# Patient Record
Sex: Female | Born: 1940
Health system: Southern US, Community
[De-identification: ages and names within clinical notes are randomized; demographics above are authoritative.]

## PROBLEM LIST (undated history)

## (undated) DIAGNOSIS — Z96659 Presence of unspecified artificial knee joint: Secondary | ICD-10-CM

## (undated) DIAGNOSIS — Z90710 Acquired absence of both cervix and uterus: Secondary | ICD-10-CM

## (undated) DIAGNOSIS — Z9889 Other specified postprocedural states: Secondary | ICD-10-CM

## (undated) HISTORY — PX: BREAST BIOPSY: SHX20

## (undated) HISTORY — DX: Acquired absence of both cervix and uterus: Z90.710

## (undated) HISTORY — PX: REPLACEMENT TOTAL KNEE: SUR1224

## (undated) HISTORY — DX: Presence of unspecified artificial knee joint: Z96.659

## (undated) HISTORY — DX: Other specified postprocedural states: Z98.890

## (undated) HISTORY — PX: ABDOMINAL HYSTERECTOMY: SHX81

## (undated) HISTORY — PX: THYROID SURGERY: SHX805

---

## 1999-06-22 ENCOUNTER — Other Ambulatory Visit: Admission: RE | Admit: 1999-06-22 | Discharge: 1999-06-22 | Payer: Self-pay | Admitting: *Deleted

## 1999-06-22 ENCOUNTER — Encounter (INDEPENDENT_AMBULATORY_CARE_PROVIDER_SITE_OTHER): Payer: Self-pay | Admitting: Specialist

## 1999-08-30 ENCOUNTER — Inpatient Hospital Stay (HOSPITAL_COMMUNITY): Admission: RE | Admit: 1999-08-30 | Discharge: 1999-09-01 | Payer: Self-pay | Admitting: *Deleted

## 1999-08-30 ENCOUNTER — Encounter (INDEPENDENT_AMBULATORY_CARE_PROVIDER_SITE_OTHER): Payer: Self-pay | Admitting: Specialist

## 2000-05-30 ENCOUNTER — Encounter: Payer: Self-pay | Admitting: Internal Medicine

## 2000-05-30 ENCOUNTER — Encounter: Admission: RE | Admit: 2000-05-30 | Discharge: 2000-05-30 | Payer: Self-pay | Admitting: Internal Medicine

## 2001-06-09 ENCOUNTER — Encounter: Admission: RE | Admit: 2001-06-09 | Discharge: 2001-06-09 | Payer: Self-pay | Admitting: Family Medicine

## 2001-06-09 ENCOUNTER — Encounter: Payer: Self-pay | Admitting: Family Medicine

## 2002-08-16 ENCOUNTER — Encounter: Admission: RE | Admit: 2002-08-16 | Discharge: 2002-08-16 | Payer: Self-pay | Admitting: Family Medicine

## 2002-08-16 ENCOUNTER — Encounter: Payer: Self-pay | Admitting: Family Medicine

## 2003-07-12 ENCOUNTER — Other Ambulatory Visit: Admission: RE | Admit: 2003-07-12 | Discharge: 2003-07-12 | Payer: Self-pay | Admitting: *Deleted

## 2003-11-16 ENCOUNTER — Encounter: Admission: RE | Admit: 2003-11-16 | Discharge: 2003-11-16 | Payer: Self-pay | Admitting: Family Medicine

## 2004-07-24 ENCOUNTER — Other Ambulatory Visit: Admission: RE | Admit: 2004-07-24 | Discharge: 2004-07-24 | Payer: Self-pay | Admitting: *Deleted

## 2004-12-19 ENCOUNTER — Ambulatory Visit (HOSPITAL_COMMUNITY): Admission: RE | Admit: 2004-12-19 | Discharge: 2004-12-20 | Payer: Self-pay | Admitting: Ophthalmology

## 2005-08-02 ENCOUNTER — Ambulatory Visit: Payer: Self-pay | Admitting: Family Medicine

## 2005-08-26 ENCOUNTER — Ambulatory Visit: Payer: Self-pay | Admitting: Family Medicine

## 2005-09-25 ENCOUNTER — Ambulatory Visit: Payer: Self-pay | Admitting: Family Medicine

## 2007-07-07 ENCOUNTER — Encounter (INDEPENDENT_AMBULATORY_CARE_PROVIDER_SITE_OTHER): Payer: Self-pay | Admitting: General Surgery

## 2007-07-07 ENCOUNTER — Ambulatory Visit (HOSPITAL_BASED_OUTPATIENT_CLINIC_OR_DEPARTMENT_OTHER): Admission: RE | Admit: 2007-07-07 | Discharge: 2007-07-07 | Payer: Self-pay | Admitting: General Surgery

## 2009-09-27 ENCOUNTER — Encounter: Admission: RE | Admit: 2009-09-27 | Discharge: 2009-09-27 | Payer: Self-pay | Admitting: Legal Medicine

## 2010-10-10 ENCOUNTER — Encounter: Admission: RE | Admit: 2010-10-10 | Discharge: 2010-10-10 | Payer: Self-pay | Admitting: Legal Medicine

## 2011-04-26 NOTE — Op Note (Signed)
Monica Marshall, Monica Marshall                ACCOUNT NO.:  [id number]   MEDICAL RECORD NO.:  [id number]          PATIENT TYPE:  OIB   LOCATION:  2899                         FACILITY:  [facility]   PHYSICIAN:  Lanna Poche, M.D. DATE OF BIRTH:  January 10, 1941   DATE OF PROCEDURE:  12/19/2004  DATE OF DISCHARGE:                                 OPERATIVE REPORT   PREOPERATIVE DIAGNOSIS:  Idiopathic macula hole, right eye.   POSTOPERATIVE DIAGNOSIS:  Idiopathic macula hole, right eye.   PROCEDURE:  Pars vitrectomy, membrane peeling and prophylactic laser  photocoagulation.  Gas fluid exchange.  Serum patch, 16% C3F8, right eye.   SURGEON:  Lanna Poche, M.D.   ASSISTANT:  Broadus John, LPN.   ANESTHESIA:  General endotracheal.   ESTIMATED BLOOD LOSS:  Less than 1 mL.   COMPLICATIONS:  None.   OPERATIVE NOTE:  The patient was taken to the operating room after the  induction of general anesthesia.  The right eye was prepped and draped in  the usual fashion.  The lid speculum was introduced.  The conjunctival  peritomy was developed temporally and superonasally with relaxing incisions  at 9 o'clock  and 1 o'clock  Hemostasis was obtained with eraser cautery.  Sclerotomies were fashioned 3.75 mm posterior to the limbus at 1:30 o'clock,  10:30 o'clock, and 7:30.  The superior sclerotomies were plugged, and a 4 mm  infusion cannula secured at the 7:30 sclerotomy with temporal suture of 7-0  Vicryl.  The tip was visually inspected and found to be in good position.  A  Landers ring was secured to the globe with 7-0 Vicryl sutures at 3 and 9.  The plugs were removed and a 30-degree presbyopic lens applied to the  surface of the eye.  Central core followed by a peripheral vitrectomy was  performed.  There was no posterior hyoid separation and the suction port of  vitrector was used to strip the posterior hyaloid off of the posterior  segment.  The peripheral vitrectomy was then completed.  A  magnified flat  lens was applied to the surface of the eye.  The absence of cortical  vitreous was confirmed with a silicone-tipped catheter, and then with the  MVR blade made two small incisions just into the surface of the retina  superotemporally and superonasally to the fovea.  The view was somewhat  impeded upon the cataract but the edges of the ILM were able to be lifted up  and peeled in two or three fragments with some difficulty.  I encountered an  adhesion of the ILM to the retina.  After the ILM had been removed, the  instrument was removed from the eye and the hole was plugged.  The lens and  lens ring were removed.  Inspection with indirect ophthalmoscope and scleral  pressure revealed there to be the suspicious area at 6 o'clock as well as  another area. These were surrounded with the indirect laser.  No clear full-  thickness retinal breaks were seen.  Using the indirect ophthalmoscope, a  gas fluid exchange was then performed.  The serum and the 16% C3F8 was drawn  up in the usual sterile fashion.  Attention was directed back to the eye  where the small amount of fluid settled posteriorly was removed, and the 2  drops of autologous serum placed over the macula.  The supranasal sclerotomy  was closed with 7-0 Vicryl.  Twenty mL of 16% C3F8 were infused through the  infusion line with egress through th supratemporal sclerotomy.  The  supratemporal sclerotomy was then closed.  The pressure was injected to 21  mmHg, and the infusion cannula removed.  The pressure was found to be  slightly elevated and was dropped down by letting a small amount of air  escape through the superior sclerotomy with the 0.12 forceps.  Pressure was  left at 21.  The conjunctiva was then drawn up and reapproximated with  interrupted running suture of 6-0 plain gut.  The subconjunctival space was  then injected with 10 mg of Decadron and 4 mg of ceftazidime as well as  irrigated with 0.75% Marcaine.   The lid speculum was then removed, and mixed  antibiotic ointment was applied to the surface of the eye.  A patch and  shield were then placed over the patient's right eye.  Upon waking from  anesthesia, the patient left the operating room in stable condition.      JTH/MEDQ  D:  12/19/2004  T:  12/19/2004  Job:  [id number]

## 2011-09-09 ENCOUNTER — Other Ambulatory Visit: Payer: Self-pay | Admitting: Legal Medicine

## 2011-09-16 ENCOUNTER — Other Ambulatory Visit: Payer: Self-pay | Admitting: Legal Medicine

## 2011-09-16 DIAGNOSIS — N6452 Nipple discharge: Secondary | ICD-10-CM

## 2011-09-18 ENCOUNTER — Ambulatory Visit
Admission: RE | Admit: 2011-09-18 | Discharge: 2011-09-18 | Disposition: A | Discharge status: Home / Self Care | Payer: Medicare HMO | Source: Ambulatory Visit | Attending: Legal Medicine | Admitting: Legal Medicine

## 2011-09-18 DIAGNOSIS — N6452 Nipple discharge: Secondary | ICD-10-CM

## 2011-12-24 DIAGNOSIS — N649 Disorder of breast, unspecified: Secondary | ICD-10-CM | POA: Diagnosis not present

## 2012-04-30 DIAGNOSIS — M25569 Pain in unspecified knee: Secondary | ICD-10-CM | POA: Diagnosis not present

## 2012-04-30 DIAGNOSIS — M1710 Unilateral primary osteoarthritis, unspecified knee: Secondary | ICD-10-CM | POA: Diagnosis not present

## 2012-05-07 DIAGNOSIS — H43819 Vitreous degeneration, unspecified eye: Secondary | ICD-10-CM | POA: Diagnosis not present

## 2012-05-07 DIAGNOSIS — H2510 Age-related nuclear cataract, unspecified eye: Secondary | ICD-10-CM | POA: Diagnosis not present

## 2012-05-07 DIAGNOSIS — M21619 Bunion of unspecified foot: Secondary | ICD-10-CM | POA: Diagnosis not present

## 2012-05-07 DIAGNOSIS — H35379 Puckering of macula, unspecified eye: Secondary | ICD-10-CM | POA: Diagnosis not present

## 2012-05-07 DIAGNOSIS — H31009 Unspecified chorioretinal scars, unspecified eye: Secondary | ICD-10-CM | POA: Diagnosis not present

## 2012-06-16 DIAGNOSIS — M2010 Hallux valgus (acquired), unspecified foot: Secondary | ICD-10-CM | POA: Diagnosis not present

## 2012-06-16 DIAGNOSIS — M21619 Bunion of unspecified foot: Secondary | ICD-10-CM | POA: Diagnosis not present

## 2012-08-17 DIAGNOSIS — R5381 Other malaise: Secondary | ICD-10-CM | POA: Diagnosis not present

## 2012-08-17 DIAGNOSIS — D649 Anemia, unspecified: Secondary | ICD-10-CM | POA: Diagnosis not present

## 2012-08-17 DIAGNOSIS — Z7901 Long term (current) use of anticoagulants: Secondary | ICD-10-CM | POA: Diagnosis not present

## 2012-08-17 DIAGNOSIS — R9431 Abnormal electrocardiogram [ECG] [EKG]: Secondary | ICD-10-CM | POA: Diagnosis not present

## 2012-08-17 DIAGNOSIS — Z01812 Encounter for preprocedural laboratory examination: Secondary | ICD-10-CM | POA: Diagnosis not present

## 2012-08-17 DIAGNOSIS — Z79899 Other long term (current) drug therapy: Secondary | ICD-10-CM | POA: Diagnosis not present

## 2012-08-17 DIAGNOSIS — R5383 Other fatigue: Secondary | ICD-10-CM | POA: Diagnosis not present

## 2012-08-18 DIAGNOSIS — H269 Unspecified cataract: Secondary | ICD-10-CM | POA: Diagnosis not present

## 2012-08-18 DIAGNOSIS — D649 Anemia, unspecified: Secondary | ICD-10-CM | POA: Diagnosis not present

## 2012-08-18 DIAGNOSIS — M1710 Unilateral primary osteoarthritis, unspecified knee: Secondary | ICD-10-CM | POA: Diagnosis not present

## 2012-08-18 DIAGNOSIS — E049 Nontoxic goiter, unspecified: Secondary | ICD-10-CM | POA: Diagnosis not present

## 2012-08-21 DIAGNOSIS — Z1231 Encounter for screening mammogram for malignant neoplasm of breast: Secondary | ICD-10-CM | POA: Diagnosis not present

## 2012-08-21 DIAGNOSIS — R079 Chest pain, unspecified: Secondary | ICD-10-CM | POA: Diagnosis not present

## 2012-08-21 DIAGNOSIS — E079 Disorder of thyroid, unspecified: Secondary | ICD-10-CM | POA: Diagnosis not present

## 2012-08-25 DIAGNOSIS — L819 Disorder of pigmentation, unspecified: Secondary | ICD-10-CM | POA: Diagnosis not present

## 2012-08-25 DIAGNOSIS — L708 Other acne: Secondary | ICD-10-CM | POA: Diagnosis not present

## 2012-09-01 DIAGNOSIS — R1310 Dysphagia, unspecified: Secondary | ICD-10-CM | POA: Diagnosis not present

## 2012-09-01 DIAGNOSIS — E042 Nontoxic multinodular goiter: Secondary | ICD-10-CM | POA: Diagnosis not present

## 2012-09-01 DIAGNOSIS — E041 Nontoxic single thyroid nodule: Secondary | ICD-10-CM | POA: Diagnosis not present

## 2012-09-01 DIAGNOSIS — L819 Disorder of pigmentation, unspecified: Secondary | ICD-10-CM | POA: Diagnosis not present

## 2012-09-17 DIAGNOSIS — M1710 Unilateral primary osteoarthritis, unspecified knee: Secondary | ICD-10-CM | POA: Diagnosis not present

## 2012-10-06 DIAGNOSIS — Z23 Encounter for immunization: Secondary | ICD-10-CM | POA: Diagnosis not present

## 2012-10-06 DIAGNOSIS — E876 Hypokalemia: Secondary | ICD-10-CM | POA: Diagnosis not present

## 2012-10-06 DIAGNOSIS — Z7982 Long term (current) use of aspirin: Secondary | ICD-10-CM | POA: Diagnosis not present

## 2012-10-06 DIAGNOSIS — D509 Iron deficiency anemia, unspecified: Secondary | ICD-10-CM | POA: Diagnosis not present

## 2012-10-06 DIAGNOSIS — Z853 Personal history of malignant neoplasm of breast: Secondary | ICD-10-CM | POA: Diagnosis not present

## 2012-10-06 DIAGNOSIS — IMO0002 Reserved for concepts with insufficient information to code with codable children: Secondary | ICD-10-CM | POA: Diagnosis not present

## 2012-10-06 DIAGNOSIS — M1710 Unilateral primary osteoarthritis, unspecified knee: Secondary | ICD-10-CM | POA: Diagnosis not present

## 2012-10-09 DIAGNOSIS — Z96659 Presence of unspecified artificial knee joint: Secondary | ICD-10-CM | POA: Diagnosis not present

## 2012-10-09 DIAGNOSIS — R269 Unspecified abnormalities of gait and mobility: Secondary | ICD-10-CM | POA: Diagnosis not present

## 2012-10-09 DIAGNOSIS — Z471 Aftercare following joint replacement surgery: Secondary | ICD-10-CM | POA: Diagnosis not present

## 2012-10-10 DIAGNOSIS — Z96659 Presence of unspecified artificial knee joint: Secondary | ICD-10-CM | POA: Diagnosis not present

## 2012-10-10 DIAGNOSIS — R269 Unspecified abnormalities of gait and mobility: Secondary | ICD-10-CM | POA: Diagnosis not present

## 2012-10-10 DIAGNOSIS — Z471 Aftercare following joint replacement surgery: Secondary | ICD-10-CM | POA: Diagnosis not present

## 2012-10-11 DIAGNOSIS — R269 Unspecified abnormalities of gait and mobility: Secondary | ICD-10-CM | POA: Diagnosis not present

## 2012-10-11 DIAGNOSIS — Z471 Aftercare following joint replacement surgery: Secondary | ICD-10-CM | POA: Diagnosis not present

## 2012-10-11 DIAGNOSIS — Z96659 Presence of unspecified artificial knee joint: Secondary | ICD-10-CM | POA: Diagnosis not present

## 2012-10-12 DIAGNOSIS — R269 Unspecified abnormalities of gait and mobility: Secondary | ICD-10-CM | POA: Diagnosis not present

## 2012-10-12 DIAGNOSIS — Z471 Aftercare following joint replacement surgery: Secondary | ICD-10-CM | POA: Diagnosis not present

## 2012-10-12 DIAGNOSIS — Z96659 Presence of unspecified artificial knee joint: Secondary | ICD-10-CM | POA: Diagnosis not present

## 2012-10-13 DIAGNOSIS — R269 Unspecified abnormalities of gait and mobility: Secondary | ICD-10-CM | POA: Diagnosis not present

## 2012-10-13 DIAGNOSIS — Z471 Aftercare following joint replacement surgery: Secondary | ICD-10-CM | POA: Diagnosis not present

## 2012-10-13 DIAGNOSIS — Z96659 Presence of unspecified artificial knee joint: Secondary | ICD-10-CM | POA: Diagnosis not present

## 2012-10-14 DIAGNOSIS — R269 Unspecified abnormalities of gait and mobility: Secondary | ICD-10-CM | POA: Diagnosis not present

## 2012-10-14 DIAGNOSIS — Z96659 Presence of unspecified artificial knee joint: Secondary | ICD-10-CM | POA: Diagnosis not present

## 2012-10-14 DIAGNOSIS — Z471 Aftercare following joint replacement surgery: Secondary | ICD-10-CM | POA: Diagnosis not present

## 2012-10-15 DIAGNOSIS — Z471 Aftercare following joint replacement surgery: Secondary | ICD-10-CM | POA: Diagnosis not present

## 2012-10-15 DIAGNOSIS — Z96659 Presence of unspecified artificial knee joint: Secondary | ICD-10-CM | POA: Diagnosis not present

## 2012-10-15 DIAGNOSIS — R269 Unspecified abnormalities of gait and mobility: Secondary | ICD-10-CM | POA: Diagnosis not present

## 2012-10-16 DIAGNOSIS — Z471 Aftercare following joint replacement surgery: Secondary | ICD-10-CM | POA: Diagnosis not present

## 2012-10-16 DIAGNOSIS — Z96659 Presence of unspecified artificial knee joint: Secondary | ICD-10-CM | POA: Diagnosis not present

## 2012-10-16 DIAGNOSIS — R269 Unspecified abnormalities of gait and mobility: Secondary | ICD-10-CM | POA: Diagnosis not present

## 2012-10-20 DIAGNOSIS — M25569 Pain in unspecified knee: Secondary | ICD-10-CM | POA: Diagnosis not present

## 2012-10-22 DIAGNOSIS — M25569 Pain in unspecified knee: Secondary | ICD-10-CM | POA: Diagnosis not present

## 2012-10-23 DIAGNOSIS — Z96659 Presence of unspecified artificial knee joint: Secondary | ICD-10-CM | POA: Diagnosis not present

## 2012-10-27 DIAGNOSIS — M25569 Pain in unspecified knee: Secondary | ICD-10-CM | POA: Diagnosis not present

## 2012-10-29 DIAGNOSIS — M25569 Pain in unspecified knee: Secondary | ICD-10-CM | POA: Diagnosis not present

## 2012-11-02 DIAGNOSIS — Z79899 Other long term (current) drug therapy: Secondary | ICD-10-CM | POA: Diagnosis not present

## 2012-11-02 DIAGNOSIS — R5381 Other malaise: Secondary | ICD-10-CM | POA: Diagnosis not present

## 2012-11-02 DIAGNOSIS — R5383 Other fatigue: Secondary | ICD-10-CM | POA: Diagnosis not present

## 2012-11-02 DIAGNOSIS — Z01812 Encounter for preprocedural laboratory examination: Secondary | ICD-10-CM | POA: Diagnosis not present

## 2012-11-02 DIAGNOSIS — Z01818 Encounter for other preprocedural examination: Secondary | ICD-10-CM | POA: Diagnosis not present

## 2012-11-03 DIAGNOSIS — D649 Anemia, unspecified: Secondary | ICD-10-CM | POA: Diagnosis not present

## 2012-11-03 DIAGNOSIS — E049 Nontoxic goiter, unspecified: Secondary | ICD-10-CM | POA: Diagnosis not present

## 2012-11-03 DIAGNOSIS — M1710 Unilateral primary osteoarthritis, unspecified knee: Secondary | ICD-10-CM | POA: Diagnosis not present

## 2012-11-03 DIAGNOSIS — M25569 Pain in unspecified knee: Secondary | ICD-10-CM | POA: Diagnosis not present

## 2012-11-03 DIAGNOSIS — H269 Unspecified cataract: Secondary | ICD-10-CM | POA: Diagnosis not present

## 2012-11-04 DIAGNOSIS — Z96659 Presence of unspecified artificial knee joint: Secondary | ICD-10-CM | POA: Diagnosis not present

## 2012-11-06 DIAGNOSIS — Z79899 Other long term (current) drug therapy: Secondary | ICD-10-CM | POA: Diagnosis not present

## 2012-11-06 DIAGNOSIS — Z01818 Encounter for other preprocedural examination: Secondary | ICD-10-CM | POA: Diagnosis not present

## 2012-11-06 DIAGNOSIS — R5381 Other malaise: Secondary | ICD-10-CM | POA: Diagnosis not present

## 2012-11-09 DIAGNOSIS — IMO0002 Reserved for concepts with insufficient information to code with codable children: Secondary | ICD-10-CM | POA: Diagnosis not present

## 2012-11-09 DIAGNOSIS — M25569 Pain in unspecified knee: Secondary | ICD-10-CM | POA: Diagnosis not present

## 2012-11-09 DIAGNOSIS — M1710 Unilateral primary osteoarthritis, unspecified knee: Secondary | ICD-10-CM | POA: Diagnosis not present

## 2012-11-09 DIAGNOSIS — Z01818 Encounter for other preprocedural examination: Secondary | ICD-10-CM | POA: Diagnosis not present

## 2012-11-10 DIAGNOSIS — M25569 Pain in unspecified knee: Secondary | ICD-10-CM | POA: Diagnosis not present

## 2012-11-12 DIAGNOSIS — M25569 Pain in unspecified knee: Secondary | ICD-10-CM | POA: Diagnosis not present

## 2012-11-12 DIAGNOSIS — Z96659 Presence of unspecified artificial knee joint: Secondary | ICD-10-CM | POA: Diagnosis not present

## 2012-11-16 DIAGNOSIS — M25569 Pain in unspecified knee: Secondary | ICD-10-CM | POA: Diagnosis not present

## 2012-11-18 DIAGNOSIS — M25569 Pain in unspecified knee: Secondary | ICD-10-CM | POA: Diagnosis not present

## 2012-11-20 DIAGNOSIS — M1710 Unilateral primary osteoarthritis, unspecified knee: Secondary | ICD-10-CM | POA: Diagnosis not present

## 2012-11-25 DIAGNOSIS — IMO0002 Reserved for concepts with insufficient information to code with codable children: Secondary | ICD-10-CM | POA: Diagnosis not present

## 2012-11-25 DIAGNOSIS — Z471 Aftercare following joint replacement surgery: Secondary | ICD-10-CM | POA: Diagnosis not present

## 2012-11-25 DIAGNOSIS — M1710 Unilateral primary osteoarthritis, unspecified knee: Secondary | ICD-10-CM | POA: Diagnosis not present

## 2012-11-25 DIAGNOSIS — M25569 Pain in unspecified knee: Secondary | ICD-10-CM | POA: Diagnosis not present

## 2012-11-25 DIAGNOSIS — D638 Anemia in other chronic diseases classified elsewhere: Secondary | ICD-10-CM | POA: Diagnosis not present

## 2012-11-25 DIAGNOSIS — D649 Anemia, unspecified: Secondary | ICD-10-CM | POA: Diagnosis not present

## 2012-11-25 DIAGNOSIS — E049 Nontoxic goiter, unspecified: Secondary | ICD-10-CM | POA: Diagnosis not present

## 2012-11-25 DIAGNOSIS — E559 Vitamin D deficiency, unspecified: Secondary | ICD-10-CM | POA: Diagnosis not present

## 2012-11-25 DIAGNOSIS — H3320 Serous retinal detachment, unspecified eye: Secondary | ICD-10-CM | POA: Diagnosis not present

## 2012-11-25 DIAGNOSIS — R269 Unspecified abnormalities of gait and mobility: Secondary | ICD-10-CM | POA: Diagnosis not present

## 2012-11-25 DIAGNOSIS — Z96659 Presence of unspecified artificial knee joint: Secondary | ICD-10-CM | POA: Diagnosis not present

## 2012-11-28 DIAGNOSIS — H3320 Serous retinal detachment, unspecified eye: Secondary | ICD-10-CM | POA: Diagnosis not present

## 2012-11-28 DIAGNOSIS — Z471 Aftercare following joint replacement surgery: Secondary | ICD-10-CM | POA: Diagnosis not present

## 2012-11-28 DIAGNOSIS — IMO0002 Reserved for concepts with insufficient information to code with codable children: Secondary | ICD-10-CM | POA: Diagnosis not present

## 2012-11-28 DIAGNOSIS — M1710 Unilateral primary osteoarthritis, unspecified knee: Secondary | ICD-10-CM | POA: Diagnosis not present

## 2012-11-28 DIAGNOSIS — E559 Vitamin D deficiency, unspecified: Secondary | ICD-10-CM | POA: Diagnosis not present

## 2012-11-28 DIAGNOSIS — Z96659 Presence of unspecified artificial knee joint: Secondary | ICD-10-CM | POA: Diagnosis not present

## 2012-11-28 DIAGNOSIS — M8448XA Pathological fracture, other site, initial encounter for fracture: Secondary | ICD-10-CM | POA: Diagnosis not present

## 2012-11-28 DIAGNOSIS — R269 Unspecified abnormalities of gait and mobility: Secondary | ICD-10-CM | POA: Diagnosis not present

## 2012-11-28 DIAGNOSIS — G8918 Other acute postprocedural pain: Secondary | ICD-10-CM | POA: Diagnosis not present

## 2012-11-28 DIAGNOSIS — D638 Anemia in other chronic diseases classified elsewhere: Secondary | ICD-10-CM | POA: Diagnosis not present

## 2012-11-28 DIAGNOSIS — M25569 Pain in unspecified knee: Secondary | ICD-10-CM | POA: Diagnosis not present

## 2012-11-28 DIAGNOSIS — E049 Nontoxic goiter, unspecified: Secondary | ICD-10-CM | POA: Diagnosis not present

## 2012-12-04 DIAGNOSIS — G8918 Other acute postprocedural pain: Secondary | ICD-10-CM | POA: Diagnosis not present

## 2012-12-04 DIAGNOSIS — Z96659 Presence of unspecified artificial knee joint: Secondary | ICD-10-CM | POA: Diagnosis not present

## 2012-12-04 DIAGNOSIS — M8448XA Pathological fracture, other site, initial encounter for fracture: Secondary | ICD-10-CM | POA: Diagnosis not present

## 2012-12-04 DIAGNOSIS — D638 Anemia in other chronic diseases classified elsewhere: Secondary | ICD-10-CM | POA: Diagnosis not present

## 2012-12-14 DIAGNOSIS — M25569 Pain in unspecified knee: Secondary | ICD-10-CM | POA: Diagnosis not present

## 2012-12-21 DIAGNOSIS — M25569 Pain in unspecified knee: Secondary | ICD-10-CM | POA: Diagnosis not present

## 2012-12-21 DIAGNOSIS — E049 Nontoxic goiter, unspecified: Secondary | ICD-10-CM | POA: Diagnosis not present

## 2012-12-21 DIAGNOSIS — H3320 Serous retinal detachment, unspecified eye: Secondary | ICD-10-CM | POA: Diagnosis not present

## 2012-12-23 DIAGNOSIS — M25569 Pain in unspecified knee: Secondary | ICD-10-CM | POA: Diagnosis not present

## 2012-12-25 DIAGNOSIS — M25569 Pain in unspecified knee: Secondary | ICD-10-CM | POA: Diagnosis not present

## 2012-12-29 DIAGNOSIS — M25569 Pain in unspecified knee: Secondary | ICD-10-CM | POA: Diagnosis not present

## 2012-12-30 DIAGNOSIS — M25569 Pain in unspecified knee: Secondary | ICD-10-CM | POA: Diagnosis not present

## 2012-12-31 DIAGNOSIS — Z96659 Presence of unspecified artificial knee joint: Secondary | ICD-10-CM | POA: Diagnosis not present

## 2013-01-01 DIAGNOSIS — M25569 Pain in unspecified knee: Secondary | ICD-10-CM | POA: Diagnosis not present

## 2013-01-05 DIAGNOSIS — M25569 Pain in unspecified knee: Secondary | ICD-10-CM | POA: Diagnosis not present

## 2013-01-07 DIAGNOSIS — M25569 Pain in unspecified knee: Secondary | ICD-10-CM | POA: Diagnosis not present

## 2013-01-12 DIAGNOSIS — M25569 Pain in unspecified knee: Secondary | ICD-10-CM | POA: Diagnosis not present

## 2013-01-26 DIAGNOSIS — H26499 Other secondary cataract, unspecified eye: Secondary | ICD-10-CM | POA: Diagnosis not present

## 2013-01-26 DIAGNOSIS — M25569 Pain in unspecified knee: Secondary | ICD-10-CM | POA: Diagnosis not present

## 2013-01-26 DIAGNOSIS — H2589 Other age-related cataract: Secondary | ICD-10-CM | POA: Diagnosis not present

## 2013-01-26 DIAGNOSIS — H5210 Myopia, unspecified eye: Secondary | ICD-10-CM | POA: Diagnosis not present

## 2013-01-28 DIAGNOSIS — M25569 Pain in unspecified knee: Secondary | ICD-10-CM | POA: Diagnosis not present

## 2013-02-02 DIAGNOSIS — M25569 Pain in unspecified knee: Secondary | ICD-10-CM | POA: Diagnosis not present

## 2013-02-03 DIAGNOSIS — H2510 Age-related nuclear cataract, unspecified eye: Secondary | ICD-10-CM | POA: Diagnosis not present

## 2013-02-03 DIAGNOSIS — H18609 Keratoconus, unspecified, unspecified eye: Secondary | ICD-10-CM | POA: Diagnosis not present

## 2013-02-03 DIAGNOSIS — H52209 Unspecified astigmatism, unspecified eye: Secondary | ICD-10-CM | POA: Diagnosis not present

## 2013-02-03 DIAGNOSIS — H35349 Macular cyst, hole, or pseudohole, unspecified eye: Secondary | ICD-10-CM | POA: Diagnosis not present

## 2013-02-03 DIAGNOSIS — H2589 Other age-related cataract: Secondary | ICD-10-CM | POA: Diagnosis not present

## 2013-02-09 DIAGNOSIS — M25569 Pain in unspecified knee: Secondary | ICD-10-CM | POA: Diagnosis not present

## 2013-02-11 DIAGNOSIS — H52209 Unspecified astigmatism, unspecified eye: Secondary | ICD-10-CM | POA: Diagnosis not present

## 2013-02-16 DIAGNOSIS — M25569 Pain in unspecified knee: Secondary | ICD-10-CM | POA: Diagnosis not present

## 2013-02-18 DIAGNOSIS — M25569 Pain in unspecified knee: Secondary | ICD-10-CM | POA: Diagnosis not present

## 2013-02-24 DIAGNOSIS — M25569 Pain in unspecified knee: Secondary | ICD-10-CM | POA: Diagnosis not present

## 2013-02-26 DIAGNOSIS — M25569 Pain in unspecified knee: Secondary | ICD-10-CM | POA: Diagnosis not present

## 2013-03-03 DIAGNOSIS — M25569 Pain in unspecified knee: Secondary | ICD-10-CM | POA: Diagnosis not present

## 2013-10-13 DIAGNOSIS — Z1231 Encounter for screening mammogram for malignant neoplasm of breast: Secondary | ICD-10-CM | POA: Diagnosis not present

## 2013-12-15 DIAGNOSIS — Z23 Encounter for immunization: Secondary | ICD-10-CM | POA: Diagnosis not present

## 2013-12-15 DIAGNOSIS — E049 Nontoxic goiter, unspecified: Secondary | ICD-10-CM | POA: Diagnosis not present

## 2013-12-15 DIAGNOSIS — E785 Hyperlipidemia, unspecified: Secondary | ICD-10-CM | POA: Diagnosis not present

## 2013-12-15 DIAGNOSIS — D649 Anemia, unspecified: Secondary | ICD-10-CM | POA: Diagnosis not present

## 2013-12-15 DIAGNOSIS — Z0000 Encounter for general adult medical examination without abnormal findings: Secondary | ICD-10-CM | POA: Diagnosis not present

## 2014-03-22 DIAGNOSIS — L259 Unspecified contact dermatitis, unspecified cause: Secondary | ICD-10-CM | POA: Diagnosis not present

## 2014-05-18 DIAGNOSIS — E785 Hyperlipidemia, unspecified: Secondary | ICD-10-CM | POA: Diagnosis not present

## 2014-05-18 DIAGNOSIS — L259 Unspecified contact dermatitis, unspecified cause: Secondary | ICD-10-CM | POA: Diagnosis not present

## 2014-08-03 DIAGNOSIS — E049 Nontoxic goiter, unspecified: Secondary | ICD-10-CM | POA: Diagnosis not present

## 2014-08-03 DIAGNOSIS — E785 Hyperlipidemia, unspecified: Secondary | ICD-10-CM | POA: Diagnosis not present

## 2014-08-03 DIAGNOSIS — H3320 Serous retinal detachment, unspecified eye: Secondary | ICD-10-CM | POA: Diagnosis not present

## 2014-08-03 DIAGNOSIS — Z6833 Body mass index (BMI) 33.0-33.9, adult: Secondary | ICD-10-CM | POA: Diagnosis not present

## 2014-08-08 DIAGNOSIS — E049 Nontoxic goiter, unspecified: Secondary | ICD-10-CM | POA: Diagnosis not present

## 2015-01-31 DIAGNOSIS — E785 Hyperlipidemia, unspecified: Secondary | ICD-10-CM | POA: Diagnosis not present

## 2015-01-31 DIAGNOSIS — E049 Nontoxic goiter, unspecified: Secondary | ICD-10-CM | POA: Diagnosis not present

## 2015-01-31 DIAGNOSIS — H3570 Unspecified separation of retinal layers: Secondary | ICD-10-CM | POA: Diagnosis not present

## 2015-01-31 DIAGNOSIS — E739 Lactose intolerance, unspecified: Secondary | ICD-10-CM | POA: Diagnosis not present

## 2015-03-14 DIAGNOSIS — L819 Disorder of pigmentation, unspecified: Secondary | ICD-10-CM | POA: Diagnosis not present

## 2015-03-29 DIAGNOSIS — R5383 Other fatigue: Secondary | ICD-10-CM | POA: Diagnosis not present

## 2015-03-29 DIAGNOSIS — Z6834 Body mass index (BMI) 34.0-34.9, adult: Secondary | ICD-10-CM | POA: Diagnosis not present

## 2015-03-29 DIAGNOSIS — Z8349 Family history of other endocrine, nutritional and metabolic diseases: Secondary | ICD-10-CM | POA: Diagnosis not present

## 2015-03-30 DIAGNOSIS — E785 Hyperlipidemia, unspecified: Secondary | ICD-10-CM | POA: Diagnosis not present

## 2015-03-30 DIAGNOSIS — E739 Lactose intolerance, unspecified: Secondary | ICD-10-CM | POA: Diagnosis not present

## 2015-03-30 DIAGNOSIS — E049 Nontoxic goiter, unspecified: Secondary | ICD-10-CM | POA: Diagnosis not present

## 2015-04-20 DIAGNOSIS — H26492 Other secondary cataract, left eye: Secondary | ICD-10-CM | POA: Diagnosis not present

## 2015-04-20 DIAGNOSIS — H338 Other retinal detachments: Secondary | ICD-10-CM | POA: Diagnosis not present

## 2015-05-19 DIAGNOSIS — L239 Allergic contact dermatitis, unspecified cause: Secondary | ICD-10-CM | POA: Diagnosis not present

## 2015-05-19 DIAGNOSIS — Z6835 Body mass index (BMI) 35.0-35.9, adult: Secondary | ICD-10-CM | POA: Diagnosis not present

## 2015-06-20 DIAGNOSIS — R1310 Dysphagia, unspecified: Secondary | ICD-10-CM | POA: Diagnosis not present

## 2015-06-20 DIAGNOSIS — E041 Nontoxic single thyroid nodule: Secondary | ICD-10-CM | POA: Diagnosis not present

## 2015-07-03 ENCOUNTER — Other Ambulatory Visit: Payer: Self-pay

## 2015-07-03 DIAGNOSIS — C73 Malignant neoplasm of thyroid gland: Secondary | ICD-10-CM | POA: Diagnosis not present

## 2015-07-03 DIAGNOSIS — E041 Nontoxic single thyroid nodule: Secondary | ICD-10-CM | POA: Diagnosis not present

## 2015-07-03 DIAGNOSIS — Z7982 Long term (current) use of aspirin: Secondary | ICD-10-CM | POA: Diagnosis not present

## 2015-07-03 DIAGNOSIS — R1310 Dysphagia, unspecified: Secondary | ICD-10-CM | POA: Diagnosis not present

## 2015-08-01 DIAGNOSIS — E785 Hyperlipidemia, unspecified: Secondary | ICD-10-CM | POA: Diagnosis not present

## 2015-08-01 DIAGNOSIS — R490 Dysphonia: Secondary | ICD-10-CM | POA: Diagnosis not present

## 2015-08-01 DIAGNOSIS — Z6833 Body mass index (BMI) 33.0-33.9, adult: Secondary | ICD-10-CM | POA: Diagnosis not present

## 2015-08-24 DIAGNOSIS — R490 Dysphonia: Secondary | ICD-10-CM | POA: Diagnosis not present

## 2015-08-24 DIAGNOSIS — E890 Postprocedural hypothyroidism: Secondary | ICD-10-CM | POA: Diagnosis not present

## 2015-09-04 DIAGNOSIS — R490 Dysphonia: Secondary | ICD-10-CM | POA: Diagnosis not present

## 2015-09-12 DIAGNOSIS — R490 Dysphonia: Secondary | ICD-10-CM | POA: Diagnosis not present

## 2015-09-13 DIAGNOSIS — R490 Dysphonia: Secondary | ICD-10-CM | POA: Diagnosis not present

## 2015-09-19 DIAGNOSIS — R490 Dysphonia: Secondary | ICD-10-CM | POA: Diagnosis not present

## 2015-09-21 DIAGNOSIS — R490 Dysphonia: Secondary | ICD-10-CM | POA: Diagnosis not present

## 2015-09-26 DIAGNOSIS — R490 Dysphonia: Secondary | ICD-10-CM | POA: Diagnosis not present

## 2015-09-28 DIAGNOSIS — R490 Dysphonia: Secondary | ICD-10-CM | POA: Diagnosis not present

## 2015-10-05 DIAGNOSIS — Z8709 Personal history of other diseases of the respiratory system: Secondary | ICD-10-CM | POA: Diagnosis not present

## 2015-10-30 DIAGNOSIS — Z23 Encounter for immunization: Secondary | ICD-10-CM | POA: Diagnosis not present

## 2015-11-09 DIAGNOSIS — Z6834 Body mass index (BMI) 34.0-34.9, adult: Secondary | ICD-10-CM | POA: Diagnosis not present

## 2015-11-09 DIAGNOSIS — L239 Allergic contact dermatitis, unspecified cause: Secondary | ICD-10-CM | POA: Diagnosis not present

## 2015-11-13 DIAGNOSIS — Z85850 Personal history of malignant neoplasm of thyroid: Secondary | ICD-10-CM | POA: Diagnosis not present

## 2015-11-23 DIAGNOSIS — Z85850 Personal history of malignant neoplasm of thyroid: Secondary | ICD-10-CM | POA: Diagnosis not present

## 2015-11-23 DIAGNOSIS — J343 Hypertrophy of nasal turbinates: Secondary | ICD-10-CM | POA: Diagnosis not present

## 2015-11-23 DIAGNOSIS — J342 Deviated nasal septum: Secondary | ICD-10-CM | POA: Diagnosis not present

## 2015-11-23 DIAGNOSIS — R490 Dysphonia: Secondary | ICD-10-CM | POA: Diagnosis not present

## 2015-11-23 DIAGNOSIS — E890 Postprocedural hypothyroidism: Secondary | ICD-10-CM | POA: Diagnosis not present

## 2015-12-21 DIAGNOSIS — Z6833 Body mass index (BMI) 33.0-33.9, adult: Secondary | ICD-10-CM | POA: Diagnosis not present

## 2015-12-21 DIAGNOSIS — L209 Atopic dermatitis, unspecified: Secondary | ICD-10-CM | POA: Diagnosis not present

## 2016-01-29 DIAGNOSIS — Z6833 Body mass index (BMI) 33.0-33.9, adult: Secondary | ICD-10-CM | POA: Diagnosis not present

## 2016-01-29 DIAGNOSIS — C73 Malignant neoplasm of thyroid gland: Secondary | ICD-10-CM | POA: Diagnosis not present

## 2016-01-29 DIAGNOSIS — E785 Hyperlipidemia, unspecified: Secondary | ICD-10-CM | POA: Diagnosis not present

## 2016-04-08 DIAGNOSIS — L209 Atopic dermatitis, unspecified: Secondary | ICD-10-CM | POA: Diagnosis not present

## 2016-04-08 DIAGNOSIS — Z6833 Body mass index (BMI) 33.0-33.9, adult: Secondary | ICD-10-CM | POA: Diagnosis not present

## 2016-05-23 DIAGNOSIS — J342 Deviated nasal septum: Secondary | ICD-10-CM | POA: Diagnosis not present

## 2016-05-23 DIAGNOSIS — R490 Dysphonia: Secondary | ICD-10-CM | POA: Diagnosis not present

## 2016-05-23 DIAGNOSIS — J3801 Paralysis of vocal cords and larynx, unilateral: Secondary | ICD-10-CM | POA: Diagnosis not present

## 2016-05-23 DIAGNOSIS — H9319 Tinnitus, unspecified ear: Secondary | ICD-10-CM | POA: Diagnosis not present

## 2016-05-23 DIAGNOSIS — Z85850 Personal history of malignant neoplasm of thyroid: Secondary | ICD-10-CM | POA: Diagnosis not present

## 2016-06-24 DIAGNOSIS — J3800 Paralysis of vocal cords and larynx, unspecified: Secondary | ICD-10-CM | POA: Diagnosis not present

## 2016-06-24 DIAGNOSIS — E785 Hyperlipidemia, unspecified: Secondary | ICD-10-CM | POA: Diagnosis not present

## 2016-06-24 DIAGNOSIS — E739 Lactose intolerance, unspecified: Secondary | ICD-10-CM | POA: Diagnosis not present

## 2016-06-24 DIAGNOSIS — Z6827 Body mass index (BMI) 27.0-27.9, adult: Secondary | ICD-10-CM | POA: Diagnosis not present

## 2016-06-24 DIAGNOSIS — C73 Malignant neoplasm of thyroid gland: Secondary | ICD-10-CM | POA: Diagnosis not present

## 2016-07-30 DIAGNOSIS — C73 Malignant neoplasm of thyroid gland: Secondary | ICD-10-CM | POA: Diagnosis not present

## 2016-07-30 DIAGNOSIS — J3800 Paralysis of vocal cords and larynx, unspecified: Secondary | ICD-10-CM | POA: Diagnosis not present

## 2016-07-30 DIAGNOSIS — E785 Hyperlipidemia, unspecified: Secondary | ICD-10-CM | POA: Diagnosis not present

## 2016-07-30 DIAGNOSIS — E739 Lactose intolerance, unspecified: Secondary | ICD-10-CM | POA: Diagnosis not present

## 2016-08-08 ENCOUNTER — Other Ambulatory Visit: Payer: Self-pay

## 2016-12-06 DIAGNOSIS — H35373 Puckering of macula, bilateral: Secondary | ICD-10-CM | POA: Diagnosis not present

## 2016-12-06 DIAGNOSIS — H26492 Other secondary cataract, left eye: Secondary | ICD-10-CM | POA: Diagnosis not present

## 2017-01-14 DIAGNOSIS — R21 Rash and other nonspecific skin eruption: Secondary | ICD-10-CM | POA: Diagnosis not present

## 2017-01-27 DIAGNOSIS — C73 Malignant neoplasm of thyroid gland: Secondary | ICD-10-CM | POA: Diagnosis not present

## 2017-01-27 DIAGNOSIS — H33001 Unspecified retinal detachment with retinal break, right eye: Secondary | ICD-10-CM | POA: Diagnosis not present

## 2017-01-27 DIAGNOSIS — E872 Acidosis: Secondary | ICD-10-CM | POA: Diagnosis not present

## 2017-01-27 DIAGNOSIS — Z23 Encounter for immunization: Secondary | ICD-10-CM | POA: Diagnosis not present

## 2017-01-27 DIAGNOSIS — E782 Mixed hyperlipidemia: Secondary | ICD-10-CM | POA: Diagnosis not present

## 2017-01-27 DIAGNOSIS — E039 Hypothyroidism, unspecified: Secondary | ICD-10-CM | POA: Diagnosis not present

## 2017-04-02 ENCOUNTER — Encounter: Payer: Self-pay | Admitting: Family Medicine

## 2017-04-02 ENCOUNTER — Ambulatory Visit (INDEPENDENT_AMBULATORY_CARE_PROVIDER_SITE_OTHER): Payer: Medicare Other | Admitting: Family Medicine

## 2017-04-02 VITALS — BP 112/55 | HR 62 | Ht 60.0 in | Wt 169.6 lb

## 2017-04-02 DIAGNOSIS — R413 Other amnesia: Secondary | ICD-10-CM | POA: Insufficient documentation

## 2017-04-02 DIAGNOSIS — F321 Major depressive disorder, single episode, moderate: Secondary | ICD-10-CM

## 2017-04-02 NOTE — Assessment & Plan Note (Signed)
MMSE score of 24/30. Passing is 28/30 ( she did some college).

## 2017-04-02 NOTE — Progress Notes (Signed)
Subjective:    Patient ID: Monica Marshall, female    DOB: 1941-11-12, 76 y.o.   MRN: [id number]  HPI 76 year old female comes in today to establish care. Her biggest concern is forgetfulness. She was hospitalized about 3 or 4 years ago and felt she was overmedicated at that time and ever since then feels like she's had difficulty with her memory. She is mostly it family members telling her that she is repeating herself. She personally doesn't feel like there is a big problem. She occasionally will forget peoples last names but usually not the first names. She denies losing things. She is able to take care of her own bills and management denture rental properties. She does her own shopping and lives alone. She feels like she sleeps well. She has no prior history of heart disease, stroke or peripheral vascular disease. She is not currently taking any prescription medications that would be causing any issues with her memory.He also not long ago lost her mother and her husband and has had to take over helping to manage rental properties etc. She does complain of feeling down several days of the week.  Dictation #1 [id number]  [id number]  Review of Systems  BP (!) 112/55   Pulse 62   Ht 5' (1.524 m)   Wt 169 lb 9.6 oz (76.9 kg)   BMI 33.12 kg/m     No Known Allergies  Past Medical History:  Diagnosis Date  . History of hysterectomy   . History of thyroid surgery   . History of total knee replacement     Past Surgical History:  Procedure Laterality Date  . ABDOMINAL HYSTERECTOMY    . REPLACEMENT TOTAL KNEE    . THYROID SURGERY      Social History   Social History  . Marital status: Married    Spouse name: N/A  . Number of children: N/A  . Years of education: N/A   Occupational History  . Not on file.   Social History Main Topics  . Smoking status: Never Smoker  . Smokeless tobacco: Never Used  . Alcohol use No  . Drug use: No  . Sexual activity: No   Other Topics  Concern  . Not on file   Social History Narrative  . No narrative on file    Family History  Problem Relation Age of Onset  . Cancer Father   . Alcohol abuse Paternal Uncle     No outpatient encounter prescriptions on file as of 04/02/2017.   No facility-administered encounter medications on file as of 04/02/2017.            Objective:   Physical Exam  Constitutional: She is oriented to person, place, and time. She appears well-developed and well-nourished.  HENT:  Head: Normocephalic and atraumatic.  Cardiovascular: Normal rate, regular rhythm and normal heart sounds.   No carotid bruits  Pulmonary/Chest: Effort normal and breath sounds normal.  Neurological: She is alert and oriented to person, place, and time.  Skin: Skin is warm and dry.  Psychiatric: She has a normal mood and affect. Her behavior is normal.       Assessment & Plan:  Memory impairment- Sounds like her family is more concerned that she really is noticing any problems. But at this point I think it is worth working up in evaluating further. Will perform any mental status exam today. I think this is also helpful in establishing a baseline. We'll get some additional lab work today just  to rule out thyroid disorder, looked like disturbance, anemia etc. We'll also check an RPR. Consider head CT for further evaluation to see if she might have some microvascular changes or eye or sign of stroke that may have caused some memory issues.  Many mental status exam score was 24 out of 30. Passing score based on the fact that she did complete some technical college is 28 out of 30. If we just count completion of high school the passing score would be 27 out of 30. She did miss a point for the day of the week as well as name the location of our building that she is new here today. She also had difficulty with copying overlapping pentagons. We also had her draw a clock face today and she labored at [address]345 and then skipped to 1617  and then did not finish. Then she read through the numbers in 2 columns. One through 6 on the right hemicolon and 7 through 12 a left tendon columns similar to a clock but was unable to set the arms of the clock to 2:30. She was also unable to recall 3 items   Depression-PHQ 9 score of 14 today. I offered to refer her for therapy or counseling especially for grieving. Also offered to consider medications if she wants to discuss it. She didn't seem very open to wanted to discuss it further discuss treatment options at this point in time. I also explained how depression can actually contracted contribute to memory impairment that certainly something we should consider addressing.

## 2017-04-03 LAB — MAGNESIUM: Magnesium: 2 mg/dL (ref 1.5–2.5)

## 2017-04-03 LAB — COMPLETE METABOLIC PANEL WITH GFR
AG Ratio: 1.5 Ratio (ref 1.0–2.5)
ALT: 13 U/L (ref 6–29)
AST: 18 U/L (ref 10–35)
Albumin: 4.3 g/dL (ref 3.6–5.1)
Alkaline Phosphatase: 77 U/L (ref 33–130)
BUN/Creatinine Ratio: 10.5 Ratio (ref 6–22)
BUN: 8 mg/dL (ref 7–25)
CO2: 27 mmol/L (ref 20–31)
Calcium: 9.5 mg/dL (ref 8.6–10.4)
Chloride: 102 mmol/L (ref 98–110)
Creat: 0.76 mg/dL (ref 0.60–0.93)
GFR, Est African American: 89 mL/min (ref >=60)
GFR, Est Non African American: 77 mL/min (ref >=60)
Globulin: 2.9 g/dL (ref 1.9–3.7)
Glucose, Bld: 87 mg/dL (ref 65–99)
Potassium: 4 mmol/L (ref 3.5–5.3)
Sodium: 140 mmol/L (ref 135–146)
Total Bilirubin: 0.9 mg/dL (ref 0.2–1.2)
Total Protein: 7.2 g/dL (ref 6.1–8.1)

## 2017-04-03 LAB — CBC WITH DIFFERENTIAL/PLATELET
Basophils Absolute: 0 cells/uL (ref 0–200)
Basophils Relative: 0 %
Eosinophils Absolute: 505 cells/uL — ABNORMAL HIGH (ref 15–500)
Eosinophils Relative: 5 %
HCT: 38.7 % (ref 35.0–45.0)
Hemoglobin: 12.4 g/dL (ref 11.7–15.5)
Lymphocytes Relative: 36 %
Lymphs Abs: 3636 cells/uL (ref 850–3900)
MCH: 30.1 pg (ref 27.0–33.0)
MCHC: 32 g/dL (ref 32.0–36.0)
MCV: 93.9 fL (ref 80.0–100.0)
MPV: 10.4 fL (ref 7.5–12.5)
Monocytes Absolute: 909 cells/uL (ref 200–950)
Monocytes Relative: 9 %
Neutro Abs: 5050 cells/uL (ref 1500–7800)
Neutrophils Relative %: 50 %
Platelets: 316 10*3/uL (ref 140–400)
RBC: 4.12 MIL/uL (ref 3.80–5.10)
RDW: 13.7 % (ref 11.0–15.0)
WBC: 10.1 10*3/uL (ref 3.8–10.8)

## 2017-04-03 LAB — TSH: TSH: 1.16 mIU/L

## 2017-04-03 LAB — RPR

## 2017-04-03 LAB — VITAMIN B12: Vitamin B-12: 843 pg/mL (ref 200–1100)

## 2017-04-03 NOTE — Progress Notes (Signed)
All labs are normal. 

## 2017-04-12 ENCOUNTER — Other Ambulatory Visit (HOSPITAL_BASED_OUTPATIENT_CLINIC_OR_DEPARTMENT_OTHER): Payer: Medicare Other

## 2017-04-19 ENCOUNTER — Ambulatory Visit (HOSPITAL_BASED_OUTPATIENT_CLINIC_OR_DEPARTMENT_OTHER)
Admission: RE | Admit: 2017-04-19 | Discharge: 2017-04-19 | Disposition: A | Discharge status: Home / Self Care | Payer: Medicare Other | Source: Ambulatory Visit | Attending: Family Medicine | Admitting: Family Medicine

## 2017-04-19 DIAGNOSIS — R413 Other amnesia: Secondary | ICD-10-CM | POA: Diagnosis not present

## 2017-04-19 MED ORDER — GADOBENATE DIMEGLUMINE 529 MG/ML IV SOLN
15.0000 mL | Freq: Once | INTRAVENOUS | Status: AC | PRN
  Administered 2017-04-19: 14 mL via INTRAVENOUS

## 2017-05-13 ENCOUNTER — Encounter: Payer: Self-pay | Admitting: Family Medicine

## 2017-05-13 ENCOUNTER — Other Ambulatory Visit: Payer: Self-pay | Admitting: Family Medicine

## 2017-05-13 ENCOUNTER — Ambulatory Visit (INDEPENDENT_AMBULATORY_CARE_PROVIDER_SITE_OTHER): Payer: Medicare Other

## 2017-05-13 ENCOUNTER — Ambulatory Visit (INDEPENDENT_AMBULATORY_CARE_PROVIDER_SITE_OTHER): Payer: Medicare Other | Admitting: Family Medicine

## 2017-05-13 VITALS — BP 126/76 | HR 64 | Ht 61.02 in | Wt 171.0 lb

## 2017-05-13 DIAGNOSIS — Z1231 Encounter for screening mammogram for malignant neoplasm of breast: Secondary | ICD-10-CM

## 2017-05-13 DIAGNOSIS — Z780 Asymptomatic menopausal state: Secondary | ICD-10-CM

## 2017-05-13 DIAGNOSIS — R21 Rash and other nonspecific skin eruption: Secondary | ICD-10-CM

## 2017-05-13 DIAGNOSIS — R413 Other amnesia: Secondary | ICD-10-CM | POA: Diagnosis not present

## 2017-05-13 DIAGNOSIS — Z13820 Encounter for screening for osteoporosis: Secondary | ICD-10-CM

## 2017-05-13 DIAGNOSIS — Z23 Encounter for immunization: Secondary | ICD-10-CM | POA: Diagnosis not present

## 2017-05-13 DIAGNOSIS — F321 Major depressive disorder, single episode, moderate: Secondary | ICD-10-CM

## 2017-05-13 DIAGNOSIS — Z0000 Encounter for general adult medical examination without abnormal findings: Secondary | ICD-10-CM | POA: Diagnosis not present

## 2017-05-13 MED ORDER — AMBULATORY NON FORMULARY MEDICATION: 0 refills | Status: DC

## 2017-05-13 MED ORDER — TRIAMCINOLONE ACETONIDE 0.5 % EX OINT: 1.0000 "application " | TOPICAL_OINTMENT | Freq: Every day | CUTANEOUS | 0 refills | Status: DC | PRN

## 2017-05-13 MED ORDER — DONEPEZIL HCL 5 MG PO TABS: 5.0000 mg | ORAL_TABLET | Freq: Every day | ORAL | 1 refills | Status: DC

## 2017-05-13 NOTE — Progress Notes (Signed)
   Subjective:    Patient ID: Monica Marshall, female    DOB: Feb 09, 1941, 76 y.o.   MRN: [id number]  HPI Patient also recently come in for some concerns that her memory loss. We did some blood work which was essentially negative. Her many mental status exam screened positive. And we did do an MRI of the brain on May 12. MRI just showed some cerebral volume loss since 2011 but brain volume still appeared congruent to age.  She's also had a rash around her neck that she describes as itchy. She says it's been coming and going for years. Lately she's been using an over-the-counter cream but sometimes in the past she's had to get a prescription cream. The itching is worse at night. It's only around her neck. She does sometimes wear a necklace on the weekends but usually not during the week.   Review of Systems     Objective:   Physical Exam  Constitutional: She is oriented to person, place, and time. She appears well-developed and well-nourished.  HENT:  Head: Normocephalic and atraumatic.  Cardiovascular: Normal rate, regular rhythm and normal heart sounds.   Pulmonary/Chest: Effort normal and breath sounds normal.  Neurological: She is alert and oriented to person, place, and time.  Skin: Skin is warm and dry.  Isn't erythematous maculopapular rash around the neck. With some discoloration of the skin. Open wounds or cracking. She has some excoriations.  Psychiatric: She has a normal mood and affect. Her behavior is normal.          Assessment & Plan:  Memory impairment-discussed options. I think she'll be a good candidate to go ahead and start Aricept. We'll start with 5 mg daily and see if she does okay with this. I'll see her back in 2 months and we can make adjustments if needed. Discussed potential side effects.  Depression-at this point she feels like she is not interested in any type of therapy or medication. He did encourage her to start exercising more regularly. She said she used  to walk for exercise but had stopped.  Rash-unclear etiology. Consider contact dermatitis since it's just around her collar area. Will treat with triamcinolone ointment. Call if not improving after 2 weeks.

## 2017-05-13 NOTE — Progress Notes (Signed)
Subjective:   Monica Marshall is a 76 y.o. female who presents for Medicare Annual (Subsequent) preventive examination.  Patient also recently come in for some concerns that her memory loss. We did some blood work which was essentially negative. Her many mental status exam screened positive. And we did do an MRI of the brain on May 12. MRI just showed some cerebral volume loss since 2011 but brain volume still appeared congruent to age.  Review of Systems:  Comprehensive review of systems is negative.       Objective:     Vitals: BP 126/76   Pulse 64   Ht 5' 1.02" (1.55 m)   Wt 171 lb (77.6 kg)   SpO2 100%   BMI 32.28 kg/m   Body mass index is 32.28 kg/m.   Tobacco History  Smoking Status  . Never Smoker  Smokeless Tobacco  . Never Used     Counseling given: Not Answered   Past Medical History:  Diagnosis Date  . History of hysterectomy   . History of thyroid surgery   . History of total knee replacement    Past Surgical History:  Procedure Laterality Date  . ABDOMINAL HYSTERECTOMY    . REPLACEMENT TOTAL KNEE    . THYROID SURGERY     Family History  Problem Relation Age of Onset  . Cancer Father   . Alcohol abuse Paternal Uncle    History  Sexual Activity  . Sexual activity: No    Outpatient Encounter Prescriptions as of 05/13/2017  Medication Sig  . AMBULATORY NON FORMULARY MEDICATION Medication Name: Tdap give IM x 1  . donepezil (ARICEPT) 5 MG tablet Take 1 tablet (5 mg total) by mouth at bedtime.  . triamcinolone ointment (KENALOG) 0.5 % Apply 1 application topically daily as needed.   No facility-administered encounter medications on file as of 05/13/2017.     Activities of Daily Living In your present state of health, do you have any difficulty performing the following activities: 05/13/2017  Hearing? N  Vision? N  Difficulty concentrating or making decisions? Y  Walking or climbing stairs? N  Dressing or bathing? N  Doing errands, shopping? N   Some recent data might be hidden    Patient Care Team: Hali Marry, MD as PCP - General (Family Medicine)    Assessment:    Medicare wellness exam   Exercise Activities and Dietary recommendations Current Exercise Habits: The patient does not participate in regular exercise at present, Exercise limited by: None identified  Goals    None     Fall Risk Fall Risk  04/02/2017 08/08/2016  Falls in the past year? No No   Depression Screen PHQ 2/9 Scores 04/02/2017  PHQ - 2 Score 3  PHQ- 9 Score 14     Cognitive Function     6CIT Screen 05/13/2017  What Year? 0 points  What month? 0 points  What time? 0 points  Count back from 20 0 points  Months in reverse 0 points  Repeat phrase 10 points  Total Score 10     There is no immunization history on file for this patient. Screening Tests Health Maintenance  Topic Date Due  . TETANUS/TDAP  07/14/1960  . COLONOSCOPY  07/15/1991  . DEXA SCAN  07/14/2006  . PNA vac Low Risk Adult (1 of 2 - PCV13) 07/14/2006  . INFLUENZA VACCINE  07/09/2017      Plan:      I have personally reviewed and  noted the following in the patient's chart:   . Medical and social history - updated . Use of alcohol, tobacco or illicit drugs - negative for use.  . Current medications and supplements - none . Functional ability and status . Nutritional status . Physical activity - encouraged her to start walking again.  . Advanced directives - given information . List of other physicians - no others.  Marland Kitchen Hospitalizations, surgeries, and ER visits in previous 12 months . Vitals - none . Screenings to include cognitive, depression, and falls - done . mammo ordered.  . Referrals and appointments  In addition, I have reviewed and discussed with patient certain preventive protocols, quality metrics, and best practice recommendations. A written personalized care plan for preventive services as well as general preventive health  recommendations were provided to patient.     Ivannah Zody, MD  05/14/2017

## 2017-05-13 NOTE — Patient Instructions (Addendum)
Prescription printed for tetanus vaccine to take to the pharmacy. Order placed for your mammogram. That should be calling you to schedule. We'll start Aricept. Follow-up in 2 months for recheck. Will place order for Cologuard for colon cancer screening. They will send the kit to your house.    \  Health Maintenance, Female Adopting a healthy lifestyle and getting preventive care can go a long way to promote health and wellness. Talk with your health care provider about what schedule of regular examinations is right for you. This is a good chance for you to check in with your provider about disease prevention and staying healthy. In between checkups, there are plenty of things you can do on your own. Experts have done a lot of research about which lifestyle changes and preventive measures are most likely to keep you healthy. Ask your health care provider for more information. Weight and diet Eat a healthy diet  Be sure to include plenty of vegetables, fruits, low-fat dairy products, and lean protein.  Do not eat a lot of foods high in solid fats, added sugars, or salt.  Get regular exercise. This is one of the most important things you can do for your health. ? Most adults should exercise for at least 150 minutes each week. The exercise should increase your heart rate and make you sweat (moderate-intensity exercise). ? Most adults should also do strengthening exercises at least twice a week. This is in addition to the moderate-intensity exercise.  Maintain a healthy weight  Body mass index (BMI) is a measurement that can be used to identify possible weight problems. It estimates body fat based on height and weight. Your health care provider can help determine your BMI and help you achieve or maintain a healthy weight.  For females 61 years of age and older: ? A BMI below 18.5 is considered underweight. ? A BMI of 18.5 to 24.9 is normal. ? A BMI of 25 to 29.9 is considered overweight. ? A  BMI of 30 and above is considered obese.  Watch levels of cholesterol and blood lipids  You should start having your blood tested for lipids and cholesterol at 76 years of age, then have this test every 5 years.  You may need to have your cholesterol levels checked more often if: ? Your lipid or cholesterol levels are high. ? You are older than 76 years of age. ? You are at high risk for heart disease.  Cancer screening Lung Cancer  Lung cancer screening is recommended for adults 78-92 years old who are at high risk for lung cancer because of a history of smoking.  A yearly low-dose CT scan of the lungs is recommended for people who: ? Currently smoke. ? Have quit within the past 15 years. ? Have at least a 30-pack-year history of smoking. A pack year is smoking an average of one pack of cigarettes a day for 1 year.  Yearly screening should continue until it has been 15 years since you quit.  Yearly screening should stop if you develop a health problem that would prevent you from having lung cancer treatment.  Breast Cancer  Practice breast self-awareness. This means understanding how your breasts normally appear and feel.  It also means doing regular breast self-exams. Let your health care provider know about any changes, no matter how small.  If you are in your 20s or 30s, you should have a clinical breast exam (CBE) by a health care provider every 1-3 years as  part of a regular health exam.  If you are 40 or older, have a CBE every year. Also consider having a breast X-ray (mammogram) every year.  If you have a family history of breast cancer, talk to your health care provider about genetic screening.  If you are at high risk for breast cancer, talk to your health care provider about having an MRI and a mammogram every year.  Breast cancer gene (BRCA) assessment is recommended for women who have family members with BRCA-related cancers. BRCA-related cancers  include: ? Breast. ? Ovarian. ? Tubal. ? Peritoneal cancers.  Results of the assessment will determine the need for genetic counseling and BRCA1 and BRCA2 testing.  Cervical Cancer Your health care provider may recommend that you be screened regularly for cancer of the pelvic organs (ovaries, uterus, and vagina). This screening involves a pelvic examination, including checking for microscopic changes to the surface of your cervix (Pap test). You may be encouraged to have this screening done every 3 years, beginning at age 66.  For women ages 23-65, health care providers may recommend pelvic exams and Pap testing every 3 years, or they may recommend the Pap and pelvic exam, combined with testing for human papilloma virus (HPV), every 5 years. Some types of HPV increase your risk of cervical cancer. Testing for HPV may also be done on women of any age with unclear Pap test results.  Other health care providers may not recommend any screening for nonpregnant women who are considered low risk for pelvic cancer and who do not have symptoms. Ask your health care provider if a screening pelvic exam is right for you.  If you have had past treatment for cervical cancer or a condition that could lead to cancer, you need Pap tests and screening for cancer for at least 20 years after your treatment. If Pap tests have been discontinued, your risk factors (such as having a new sexual partner) need to be reassessed to determine if screening should resume. Some women have medical problems that increase the chance of getting cervical cancer. In these cases, your health care provider may recommend more frequent screening and Pap tests.  Colorectal Cancer  This type of cancer can be detected and often prevented.  Routine colorectal cancer screening usually begins at 76 years of age and continues through 76 years of age.  Your health care provider may recommend screening at an earlier age if you have risk factors  for colon cancer.  Your health care provider may also recommend using home test kits to check for hidden blood in the stool.  A small camera at the end of a tube can be used to examine your colon directly (sigmoidoscopy or colonoscopy). This is done to check for the earliest forms of colorectal cancer.  Routine screening usually begins at age 84.  Direct examination of the colon should be repeated every 5-10 years through 76 years of age. However, you may need to be screened more often if early forms of precancerous polyps or small growths are found.  Skin Cancer  Check your skin from head to toe regularly.  Tell your health care provider about any new moles or changes in moles, especially if there is a change in a mole's shape or color.  Also tell your health care provider if you have a mole that is larger than the size of a pencil eraser.  Always use sunscreen. Apply sunscreen liberally and repeatedly throughout the day.  Protect yourself by wearing  long sleeves, pants, a wide-brimmed hat, and sunglasses whenever you are outside.  Heart disease, diabetes, and high blood pressure  High blood pressure causes heart disease and increases the risk of stroke. High blood pressure is more likely to develop in: ? People who have blood pressure in the high end of the normal range (130-139/85-89 mm Hg). ? People who are overweight or obese. ? People who are African American.  If you are 84-53 years of age, have your blood pressure checked every 3-5 years. If you are 36 years of age or older, have your blood pressure checked every year. You should have your blood pressure measured twice-once when you are at a hospital or clinic, and once when you are not at a hospital or clinic. Record the average of the two measurements. To check your blood pressure when you are not at a hospital or clinic, you can use: ? An automated blood pressure machine at a pharmacy. ? A home blood pressure monitor.  If  you are between 81 years and 46 years old, ask your health care provider if you should take aspirin to prevent strokes.  Have regular diabetes screenings. This involves taking a blood sample to check your fasting blood sugar level. ? If you are at a normal weight and have a low risk for diabetes, have this test once every three years after 76 years of age. ? If you are overweight and have a high risk for diabetes, consider being tested at a younger age or more often. Preventing infection Hepatitis B  If you have a higher risk for hepatitis B, you should be screened for this virus. You are considered at high risk for hepatitis B if: ? You were born in a country where hepatitis B is common. Ask your health care provider which countries are considered high risk. ? Your parents were born in a high-risk country, and you have not been immunized against hepatitis B (hepatitis B vaccine). ? You have HIV or AIDS. ? You use needles to inject street drugs. ? You live with someone who has hepatitis B. ? You have had sex with someone who has hepatitis B. ? You get hemodialysis treatment. ? You take certain medicines for conditions, including cancer, organ transplantation, and autoimmune conditions.  Hepatitis C  Blood testing is recommended for: ? Everyone born from [birth year] through 1965. ? Anyone with known risk factors for hepatitis C.  Sexually transmitted infections (STIs)  You should be screened for sexually transmitted infections (STIs) including gonorrhea and chlamydia if: ? You are sexually active and are younger than 76 years of age. ? You are older than 76 years of age and your health care provider tells you that you are at risk for this type of infection. ? Your sexual activity has changed since you were last screened and you are at an increased risk for chlamydia or gonorrhea. Ask your health care provider if you are at risk.  If you do not have HIV, but are at risk, it may be recommended  that you take a prescription medicine daily to prevent HIV infection. This is called pre-exposure prophylaxis (PrEP). You are considered at risk if: ? You are sexually active and do not regularly use condoms or know the HIV status of your partner(s). ? You take drugs by injection. ? You are sexually active with a partner who has HIV.  Talk with your health care provider about whether you are at high risk of being infected with HIV.  If you choose to begin PrEP, you should first be tested for HIV. You should then be tested every 3 months for as long as you are taking PrEP. Pregnancy  If you are premenopausal and you may become pregnant, ask your health care provider about preconception counseling.  If you may become pregnant, take 400 to 800 micrograms (mcg) of folic acid every day.  If you want to prevent pregnancy, talk to your health care provider about birth control (contraception). Osteoporosis and menopause  Osteoporosis is a disease in which the bones lose minerals and strength with aging. This can result in serious bone fractures. Your risk for osteoporosis can be identified using a bone density scan.  If you are 8 years of age or older, or if you are at risk for osteoporosis and fractures, ask your health care provider if you should be screened.  Ask your health care provider whether you should take a calcium or vitamin D supplement to lower your risk for osteoporosis.  Menopause may have certain physical symptoms and risks.  Hormone replacement therapy may reduce some of these symptoms and risks. Talk to your health care provider about whether hormone replacement therapy is right for you. Follow these instructions at home:  Schedule regular health, dental, and eye exams.  Stay current with your immunizations.  Do not use any tobacco products including cigarettes, chewing tobacco, or electronic cigarettes.  If you are pregnant, do not drink alcohol.  If you are  breastfeeding, limit how much and how often you drink alcohol.  Limit alcohol intake to no more than 1 drink per day for nonpregnant women. One drink equals 12 ounces of beer, 5 ounces of wine, or 1 ounces of hard liquor.  Do not use street drugs.  Do not share needles.  Ask your health care provider for help if you need support or information about quitting drugs.  Tell your health care provider if you often feel depressed.  Tell your health care provider if you have ever been abused or do not feel safe at home. This information is not intended to replace advice given to you by your health care provider. Make sure you discuss any questions you have with your health care provider. Document Released: 06/10/2011 Document Revised: 05/02/2016 Document Reviewed: 08/29/2015 Elsevier Interactive Patient Education  [employer].

## 2017-05-14 NOTE — Progress Notes (Signed)
Depression-patient did screen positive for depression but she was not interested in any type of treatment including therapy or medication.  Memory impairment-she did screen positive for early dementia. Negative MRI of the brain and blood work was also essentially normal. We discussed medication options including starting Aricept. Warned about potential side effects. Medication since the pharmacy. I'll see her back in about 2-3 months to make sure that she is doing well on the medication.  Rash-she also mentioned a rash around her neck today. She says it has come and gone over the last couple of years. It gets itchy when it occurs. She normally does not wear jewelry around her neck except for Saturdays. She has not noticed a relationship to wearing drawer and the rash. It's only around her posterior neck no or else on her body. She is currently using an over-the-counter hydrocortisone cream but says it's really not working well. We'll go ahead and treat with topical triamcinolone ointment. If not improving over the next 10 days and please let us know.  Beatrice Lecher, MD

## 2017-06-13 DIAGNOSIS — B029 Zoster without complications: Secondary | ICD-10-CM | POA: Diagnosis not present

## 2017-06-17 DIAGNOSIS — L239 Allergic contact dermatitis, unspecified cause: Secondary | ICD-10-CM | POA: Diagnosis not present

## 2017-06-17 DIAGNOSIS — R21 Rash and other nonspecific skin eruption: Secondary | ICD-10-CM | POA: Diagnosis not present

## 2017-06-25 DIAGNOSIS — L300 Nummular dermatitis: Secondary | ICD-10-CM | POA: Diagnosis not present

## 2017-07-03 DIAGNOSIS — L281 Prurigo nodularis: Secondary | ICD-10-CM | POA: Diagnosis not present

## 2017-07-03 DIAGNOSIS — L578 Other skin changes due to chronic exposure to nonionizing radiation: Secondary | ICD-10-CM | POA: Diagnosis not present

## 2017-07-03 DIAGNOSIS — L300 Nummular dermatitis: Secondary | ICD-10-CM | POA: Diagnosis not present

## 2017-07-15 ENCOUNTER — Other Ambulatory Visit: Payer: Medicare Other

## 2017-07-15 ENCOUNTER — Ambulatory Visit: Payer: Medicare Other | Admitting: Family Medicine

## 2017-07-24 DIAGNOSIS — L300 Nummular dermatitis: Secondary | ICD-10-CM | POA: Diagnosis not present

## 2017-07-29 ENCOUNTER — Encounter: Payer: Self-pay | Admitting: Family Medicine

## 2017-07-29 ENCOUNTER — Ambulatory Visit (INDEPENDENT_AMBULATORY_CARE_PROVIDER_SITE_OTHER): Payer: Medicare Other | Admitting: Family Medicine

## 2017-07-29 VITALS — BP 125/76 | HR 61 | Wt 161.0 lb

## 2017-07-29 DIAGNOSIS — Z23 Encounter for immunization: Secondary | ICD-10-CM | POA: Diagnosis not present

## 2017-07-29 DIAGNOSIS — R413 Other amnesia: Secondary | ICD-10-CM

## 2017-07-29 MED ORDER — DONEPEZIL HCL 10 MG PO TABS: 10.0000 mg | ORAL_TABLET | Freq: Every day | ORAL | 1 refills | Status: DC

## 2017-07-29 MED ORDER — AMBULATORY NON FORMULARY MEDICATION: 0 refills | Status: DC

## 2017-07-29 NOTE — Progress Notes (Signed)
Subjective:    Patient ID: Monica Marshall, female    DOB: [birth year], 76 y.o.   MRN: [id number]  HPI 58 roll female comes in today to follow-up formemory impairment-we started her on Aricept when I saw her about 2 months ago and she is actually been doing well on that regimen. No side effects or concerns. She has not noticed any significant changes. Her daughter, Monica Marshall is here from [place] with her today for the office visit to go over the findings.  She denies feeling down or depressed.  Her daughter wanted to know which vaccines she is due for.   Review of Systems  BP 125/76   Pulse 61   Wt 161 lb (73 kg)   BMI 30.40 kg/m     No Known Allergies  Past Medical History:  Diagnosis Date  . History of hysterectomy   . History of thyroid surgery   . History of total knee replacement     Past Surgical History:  Procedure Laterality Date  . ABDOMINAL HYSTERECTOMY    . REPLACEMENT TOTAL KNEE    . THYROID SURGERY      Social History   Social History  . Marital status: Married    Spouse name: N/A  . Number of children: N/A  . Years of education: N/A   Occupational History  . Not on file.   Social History Main Topics  . Smoking status: Never Smoker  . Smokeless tobacco: Never Used  . Alcohol use No  . Drug use: No  . Sexual activity: No   Other Topics Concern  . Not on file   Social History Narrative  . No narrative on file    Family History  Problem Relation Age of Onset  . Cancer Father   . Alcohol abuse Paternal Uncle     Outpatient Encounter Prescriptions as of 07/29/2017  Medication Sig  . AMBULATORY NON FORMULARY MEDICATION Medication Name: Tdap give IM x 1  . donepezil (ARICEPT) 10 MG tablet Take 1 tablet (10 mg total) by mouth at bedtime.  . triamcinolone ointment (KENALOG) 0.5 % Apply 1 application topically daily as needed.  . [DISCONTINUED] AMBULATORY NON FORMULARY MEDICATION Medication Name: Tdap give IM x 1  . [DISCONTINUED] donepezil  (ARICEPT) 5 MG tablet Take 1 tablet (5 mg total) by mouth at bedtime.  . AMBULATORY NON FORMULARY MEDICATION Medication Name: Shingrix IM x 1, repeat in 2-6 months.   No facility-administered encounter medications on file as of 07/29/2017.          Objective:   Physical Exam  Constitutional: She is oriented to person, place, and time. She appears well-developed and well-nourished.  HENT:  Head: Normocephalic and atraumatic.  Eyes: Conjunctivae and EOM are normal.  Cardiovascular: Normal rate.   Pulmonary/Chest: Effort normal.  Neurological: She is alert and oriented to person, place, and time.  Skin: Skin is dry. No pallor.  Psychiatric: She has a normal mood and affect. Her behavior is normal.  Vitals reviewed.         Assessment & Plan:  Memory impairment-reviewed the MRI and blood work that we did. At this point there is no specific reason or cause for her memory impairment. No significant family history no prior history of head trauma. We discussed increasing the Aricept 10 mg which is the therapeutic dose and she is doing well and I will see plan to do MMSE e also discussed strategies around setting reminders using pillboxes etc.  Vaccination counseling-she will  be due for Pneumovax 23 in February. She does not know when her last tetanus was we'll give her prescription so she can get it done at the pharmacy. She is also interested in Shingrix and would like to have that done at the pharmacy as well.  Colon cancer screening-he did agree Cologuard so we will try to make sure that has been faxed.   Given flu shot today.   Time spent 25 min, > 50% spent discussing diagnosis with patient and her daughter and going over labs and MRI findings etc.

## 2017-08-17 IMAGING — MR MR HEAD WO/W CM
12 of 14 series · 38 of 48 positions shown · IV contrast (multihance)
Comparison: 01/02/2010

CLINICAL DATA: Memory loss after multiple surgeries in a short
amount of time.

EXAM:
MRI HEAD WITHOUT AND WITH CONTRAST
TECHNIQUE: Multiplanar, multiecho pulse sequences of the brain and surrounding
structures were obtained without and with intravenous contrast.
CONTRAST:  14mL MULTIHANCE GADOBENATE DIMEGLUMINE 529 MG/ML IV SOLN

[Series 2: T1 · sagittal · 5.0mm · 0.47mm/px · 1 of 23 slices shown (1 of 2)]
[im 1/23]
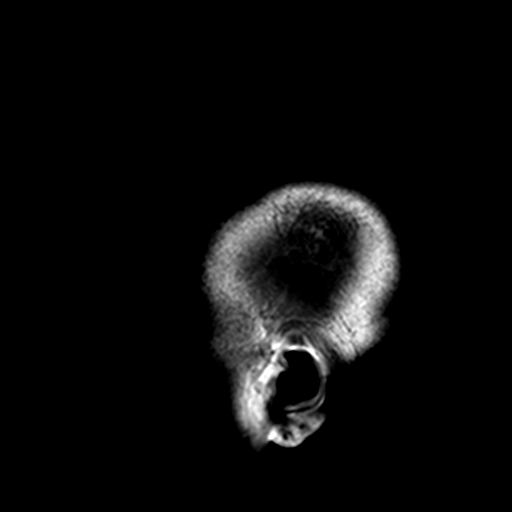

[Series 3: DWI · axial · 3.0mm · 2.19mm/px · z∈[-56,+86]mm · 8 of 90 slices shown (1 of 4)]
[im 1/90]
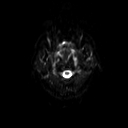
[im 13/90]
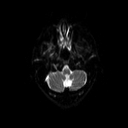
[im 26/90]
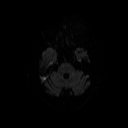
[im 39/90]
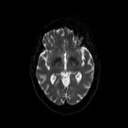
[im 51/90]
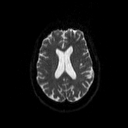
[im 64/90]
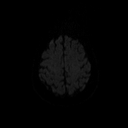
[im 77/90]
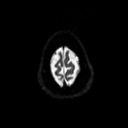
[im 90/90]
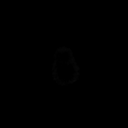

[Series 4: DWI · axial · 3.0mm · 2.19mm/px · z∈[-56,+86]mm · 4 of 45 slices shown (2 of 4)]
[im 1/45]
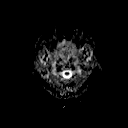
[im 15/45]
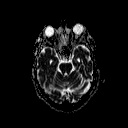
[im 30/45]
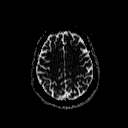
[im 45/45]
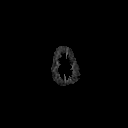

[Series 5: DWI · coronal · 3.0mm · 1.46mm/px · 8 of 90 slices shown (3 of 4)]
[im 1/90]
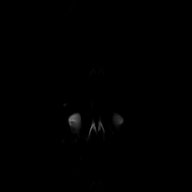
[im 13/90]
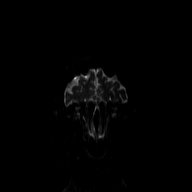
[im 26/90]
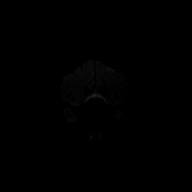
[im 39/90]
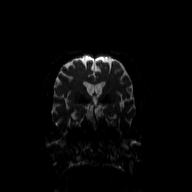
[im 51/90]
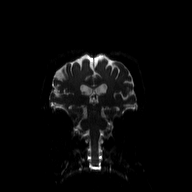
[im 64/90]
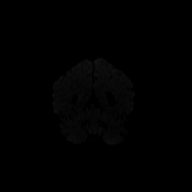
[im 77/90]
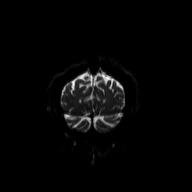
[im 90/90]
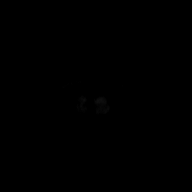

[Series 6: DWI · coronal · 3.0mm · 1.46mm/px · 4 of 45 slices shown (4 of 4)]
[im 1/45]
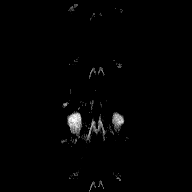
[im 15/45]
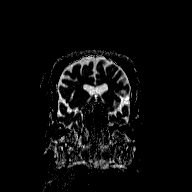
[im 30/45]
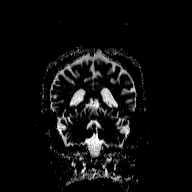
[im 45/45]
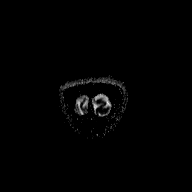

[Series 7: T2 · axial · 5.0mm · 0.45mm/px · z∈[-62,+88]mm · 2 of 23 slices shown (1 of 2)]
[im 1/23]
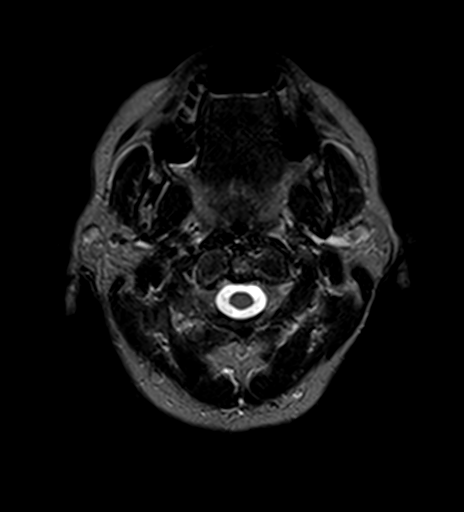
[im 23/23]
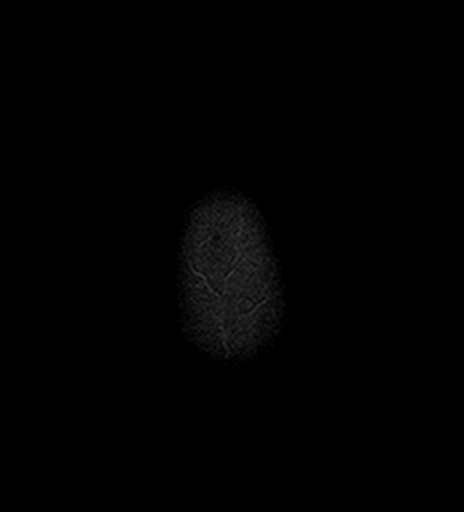

[Series 8: T2 · axial · 5.0mm · 0.45mm/px · z∈[-62,+88]mm · 2 of 23 slices shown (2 of 2)]
[im 1/23]
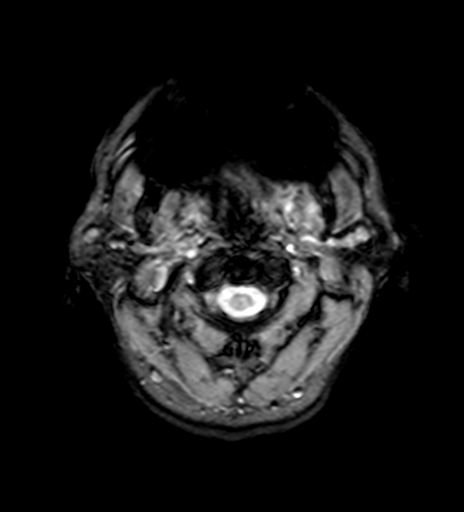
[im 23/23]
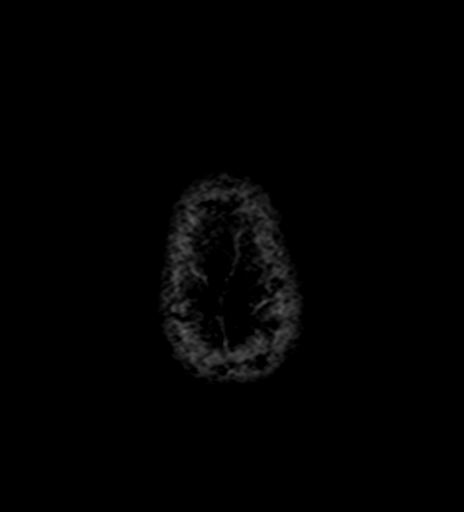

[Series 9: FLAIR · axial · 3.0mm · 0.45mm/px · z∈[-63,+89]mm · 2 of 27 slices shown]
[im 1/27]
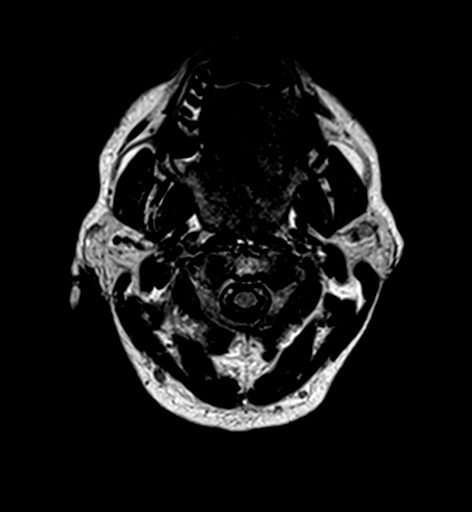
[im 27/27]
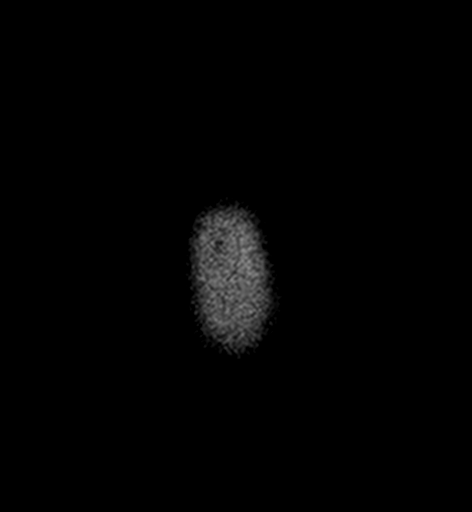

[Series 11: T2 post-contrast · coronal · 5.0mm · 0.45mm/px · 2 of 28 slices shown]
[im 1/28]
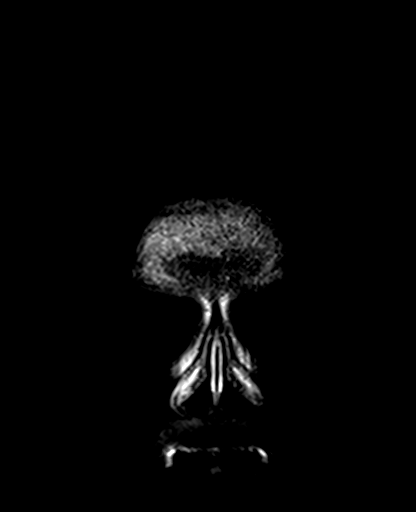
[im 28/28]
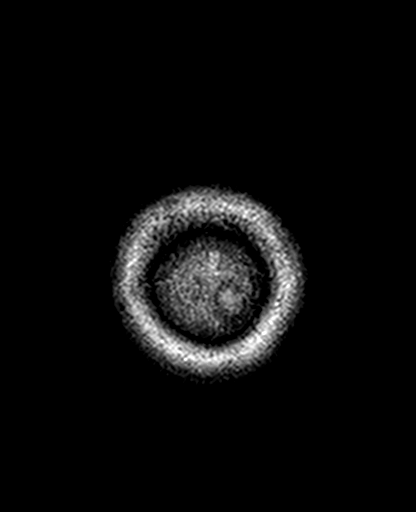

[Series 13: T1 post-contrast · coronal · 5.0mm · 0.45mm/px · 2 of 28 slices shown]
[im 1/28]
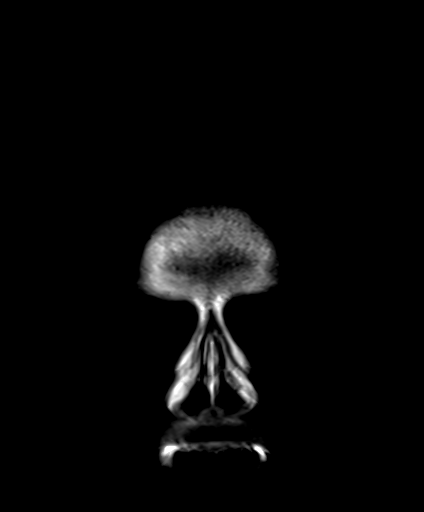
[im 28/28]
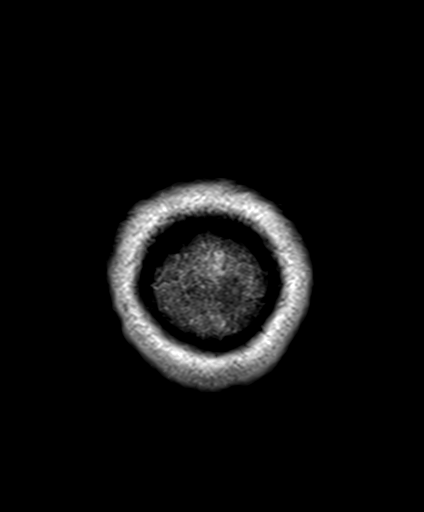

[Series 14: T1 · sagittal · 5.0mm · 0.47mm/px · 2 of 23 slices shown (2 of 2)]
[im 1/23]
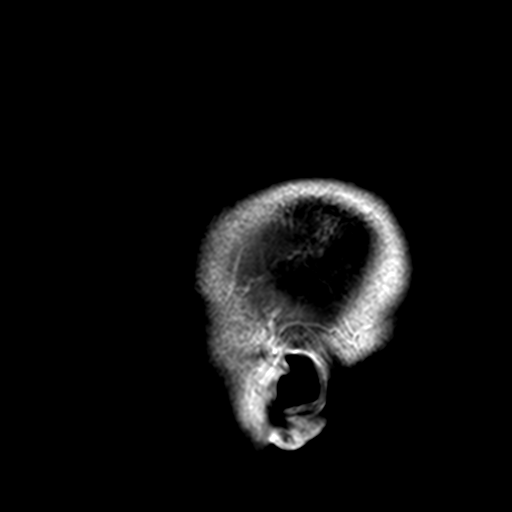
[im 23/23]
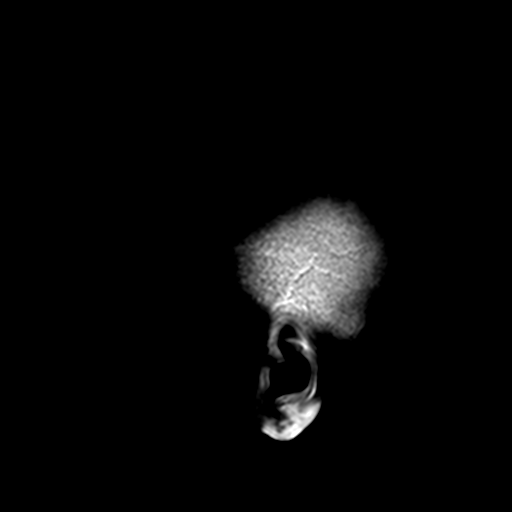

[Series 100: hx · axial · 5.0mm · 0.45mm/px · 1 of 1 slices shown]
[im 1/1]
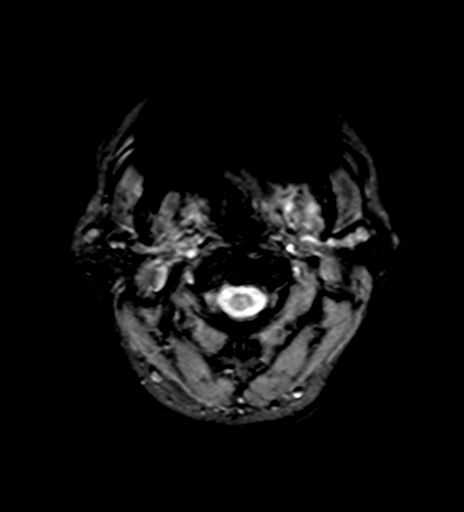

[38 of 48 positions shown; findings below may reference images not displayed]

FINDINGS: Brain: No reversible explanation for memory loss. No mass,
hydrocephalus, or extra-axial collection. No cortical or thalamic
diffusion abnormality. Mild cerebral volume loss since prior, but
age congruent brain volume. Medial temporal volume is normal. Single
remote microhemorrhage in the right centrum semiovale, nonspecific
in isolation. No abnormal intracranial enhancement.

Vascular: Major flow voids are present

Skull and upper cervical spine: Bilateral cataract resection.

Sinuses/Orbits: Mild mucosal thickening in the ethmoid sinuses.
IMPRESSION: No specific or reversible explanation for memory loss. Cerebral
volume loss since 9366, but brain volume still appears age
congruent.

## 2017-09-24 DIAGNOSIS — Z1212 Encounter for screening for malignant neoplasm of rectum: Secondary | ICD-10-CM | POA: Diagnosis not present

## 2017-09-24 DIAGNOSIS — Z1211 Encounter for screening for malignant neoplasm of colon: Secondary | ICD-10-CM | POA: Diagnosis not present

## 2017-09-24 LAB — COLOGUARD: Cologuard: NEGATIVE

## 2017-10-03 ENCOUNTER — Telehealth: Payer: Self-pay | Admitting: Family Medicine

## 2017-10-03 NOTE — Telephone Encounter (Signed)
Call patient: Cologuard is negative.  No sign of colon cancer.  Great news.

## 2017-10-06 NOTE — Telephone Encounter (Signed)
Attempted to call, VM was full.  Will try again.

## 2017-10-10 NOTE — Telephone Encounter (Signed)
Called pt's sister and informed her of results. She will inform her.Audelia Hives [place]

## 2017-10-14 ENCOUNTER — Telehealth: Payer: Self-pay | Admitting: *Deleted

## 2017-10-14 NOTE — Telephone Encounter (Signed)
Called pt's daughter back and lvm asking that she rtn call.Monica Marshall [place]

## 2017-10-14 NOTE — Telephone Encounter (Signed)
Pt's daughter lvm asking for a rtn call to discuss an upcoming appt for her mother 11/04/17.Monica Marshall, Monica Marshall

## 2017-10-15 NOTE — Telephone Encounter (Signed)
Wallie Char, patient's daughter called back and states her mom is having more than usual short term memory loss. She forgets to take her medications unless reminded. She forgets appointments unless reminded. She lives alone and her last cable bill was around $700 because people will come in and take advantage of her. She forgets to pay bills. Her daughter would like to move her to [place] with her so she can help her keep up with daily responsibilities. Her mom is not convinced this is necessary. She wanted Dr Madilyn Fireman to know about this because she will not be able to attend the appointment at the end of November.

## 2017-10-15 NOTE — Telephone Encounter (Signed)
OK , I will address during the OV.

## 2017-11-03 ENCOUNTER — Telehealth: Payer: Self-pay

## 2017-11-03 NOTE — Telephone Encounter (Signed)
Pt's daughter who lives in [place] called and said that she is really concerned about her mom because she is having to remind her everyday of appts and medications. Pt's daughter stated that the pt has an appt tomorrow at 10:40 am with her provider. Pt's daughter states that she does not even know if her mom is even taking her meds. She also states that her mom keeps telling her that she does not need any help and her daughter is really concerned. Please advise?

## 2017-11-04 ENCOUNTER — Ambulatory Visit: Payer: PRIVATE HEALTH INSURANCE | Admitting: Family Medicine

## 2017-11-10 ENCOUNTER — Telehealth: Payer: Self-pay | Admitting: Family Medicine

## 2017-11-10 NOTE — Telephone Encounter (Signed)
After pt visit this week please call daughter Joen Laura at [phone] ext. [phone]. KG LPN

## 2017-11-10 NOTE — Telephone Encounter (Signed)
Talked to her daughter Joen Laura today and discussed some of the memory issues that she is concerned about.

## 2017-11-13 ENCOUNTER — Encounter: Payer: Self-pay | Admitting: Family Medicine

## 2017-11-13 ENCOUNTER — Ambulatory Visit (INDEPENDENT_AMBULATORY_CARE_PROVIDER_SITE_OTHER): Payer: Medicare Other | Admitting: Family Medicine

## 2017-11-13 VITALS — BP 134/59 | HR 56 | Ht 61.0 in | Wt 160.0 lb

## 2017-11-13 DIAGNOSIS — R413 Other amnesia: Secondary | ICD-10-CM | POA: Diagnosis not present

## 2017-11-13 MED ORDER — DONEPEZIL HCL 10 MG PO TABS: 10.0000 mg | ORAL_TABLET | Freq: Every day | ORAL | 1 refills | Status: DC

## 2017-11-13 NOTE — Patient Instructions (Addendum)
If you would like to get the Shingrix which is the new shingles vaccine please speak with your pharmacist.  Under Medicare rules you have to get that particular one at the pharmacy.  Please schedule follow-up with me in 6 months for memory testing.

## 2017-11-13 NOTE — Progress Notes (Signed)
   Subjective:    Patient ID: Monica [place], female    DOB: 12-08-1941, 76 y.o.   MRN: [id number]  HPI 76 year old female comes in today to follow-up.  She is here today with her sister-in-law the [facility]  Follow-up memory impairment-last time she was here in August we went over the results of blood work, MRI etc.  She was doing well on the Aricept to 10 mg daily.  She is actually been doing really well on the 10 mg without any side effects or problems.  She does use a pillbox to help her remember to take her medication and she does write down a lot of appointments and things that she admits she is having hard time sometimes remembering appointments.  She still drives regularly and even gets on the highway but says she has never had any problems with forgetting where she is or where she was going.   Review of Systems  No chest pain shortness of breath or lower extremity swelling.     Objective:   Physical Exam  Constitutional: She is oriented to person, place, and time. She appears well-developed and well-nourished.  HENT:  Head: Normocephalic and atraumatic.  Cardiovascular: Normal rate, regular rhythm and normal heart sounds.  Pulmonary/Chest: Effort normal and breath sounds normal.  Neurological: She is alert and oriented to person, place, and time.  Skin: Skin is warm and dry.  Psychiatric: She has a normal mood and affect. Her behavior is normal.        Assessment & Plan:  Memory impairment -memory loss seems to be advancing.  In April of last year her many mental status exam score was 24 out of 30 and today it is 19 out of 30.  I like to see her back in 6 months.  If she continues to decline we may consider adding Namenda.  I still feel like there is a component of depression to her symptoms but at last office visit she declined any type of treatment.  We did have a long discussion today about the concerns that her daughter who lives in [place] has been having.  We talked about  possibly getting a life alert necklace or bracelet since she does live alone.  We also discussed the need for shingles vaccine-additional information provided.  Though she will needed to get this at the pharmacy.  And will be scheduled for second pneumonia vaccine in FEb with nurse visit.    Time spent 20 min, > 50% spent counseling about memory impairment.

## 2018-01-05 ENCOUNTER — Ambulatory Visit: Payer: PRIVATE HEALTH INSURANCE | Admitting: Family Medicine

## 2018-02-11 ENCOUNTER — Ambulatory Visit: Payer: PRIVATE HEALTH INSURANCE

## 2018-03-11 DIAGNOSIS — H26492 Other secondary cataract, left eye: Secondary | ICD-10-CM | POA: Diagnosis not present

## 2018-03-11 DIAGNOSIS — H35373 Puckering of macula, bilateral: Secondary | ICD-10-CM | POA: Diagnosis not present

## 2018-03-12 ENCOUNTER — Encounter: Payer: Self-pay | Admitting: Family Medicine

## 2018-03-12 ENCOUNTER — Ambulatory Visit (INDEPENDENT_AMBULATORY_CARE_PROVIDER_SITE_OTHER): Payer: Medicare Other | Admitting: Family Medicine

## 2018-03-12 ENCOUNTER — Other Ambulatory Visit: Payer: Self-pay | Admitting: Family Medicine

## 2018-03-12 VITALS — BP 122/61 | HR 60 | Ht 61.0 in | Wt 161.0 lb

## 2018-03-12 DIAGNOSIS — G301 Alzheimer's disease with late onset: Secondary | ICD-10-CM | POA: Diagnosis not present

## 2018-03-12 DIAGNOSIS — Z23 Encounter for immunization: Secondary | ICD-10-CM

## 2018-03-12 DIAGNOSIS — F0280 Dementia in other diseases classified elsewhere without behavioral disturbance: Secondary | ICD-10-CM

## 2018-03-12 DIAGNOSIS — Z13220 Encounter for screening for lipoid disorders: Secondary | ICD-10-CM | POA: Diagnosis not present

## 2018-03-12 DIAGNOSIS — R413 Other amnesia: Secondary | ICD-10-CM | POA: Diagnosis not present

## 2018-03-12 DIAGNOSIS — Z780 Asymptomatic menopausal state: Secondary | ICD-10-CM

## 2018-03-12 DIAGNOSIS — Z1231 Encounter for screening mammogram for malignant neoplasm of breast: Secondary | ICD-10-CM

## 2018-03-12 MED ORDER — TRIAMCINOLONE ACETONIDE 0.5 % EX OINT: 1.0000 "application " | TOPICAL_OINTMENT | Freq: Every day | CUTANEOUS | 99 refills | Status: DC | PRN

## 2018-03-12 NOTE — Progress Notes (Signed)
Subjective:    CC:   HPI: 77 year old female comes i in today with her sister to discuss her memory problems.  Her original follow-up was going to be in June.  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  She is been very resistant to the fact that she has been developing dementia.  She reports that she has been taking her Aricept regularly.  She is still driving but says she is never gotten lost or forgotten where she was going.  She reports that she has had to write herself more reminders. She is tolerating the medicaiton well.  She is still driving on the highway.      Past medical history, Surgical history, Family history not pertinant except as noted below, Social history, Allergies, and medications have been entered into the medical record, reviewed, and corrections made.   Review of Systems: No fevers, chills, night sweats, weight loss, chest pain, or shortness of breath.   Objective:    General: Well Developed, well nourished, and in no acute distress.  Neuro: Alert and oriented x3, extra-ocular muscles intact, sensation grossly intact.  HEENT: Normocephalic, atraumatic  Skin: Warm and dry, no rashes. Cardiac: Regular rate and rhythm, no murmurs rubs or gallops, no lower extremity edema.  Respiratory: Clear to auscultation bilaterally. Not using accessory muscles, speaking in full sentences.   Impression and Recommendations:   Alzheimer's dementia-Mini-Mental status exam performed today.  Passing score based on her age and level of education is 20 out of 30.  In May of last year her score was 24 and then in December 2018 her score was 19.  Even notes only been 4 months we can repeat it again today just to see if there has been a decline.  If there has some we will add Namenda to her regimen. Also did the 60 sec animal naming test. Only named 11 animals.   Screen lipid - I don't  have lipids on file for her.    I am happy she allowed her sister to come in today.   Pneumovax 23 given today.    Reminded due for mammogram in June. Can schedule bone  density at the same itme.    Time spent 30 min, > 50% spent face to face for dementia and testing and evaluation.

## 2018-03-12 NOTE — Patient Instructions (Signed)
You are due for Mammogram and bone density in June.

## 2018-03-13 LAB — COMPLETE METABOLIC PANEL WITH GFR
AG Ratio: 1.5 (calc) (ref 1.0–2.5)
ALT: 10 U/L (ref 6–29)
AST: 14 U/L (ref 10–35)
Albumin: 4.1 g/dL (ref 3.6–5.1)
Alkaline phosphatase (APISO): 88 U/L (ref 33–130)
BUN: 10 mg/dL (ref 7–25)
CO2: 31 mmol/L (ref 20–32)
Calcium: 9.4 mg/dL (ref 8.6–10.4)
Chloride: 104 mmol/L (ref 98–110)
Creat: 0.64 mg/dL (ref 0.60–0.93)
GFR, Est African American: 100 mL/min/{1.73_m2} (ref >=60)
GFR, Est Non African American: 87 mL/min/{1.73_m2} (ref >=60)
Globulin: 2.7 g/dL (calc) (ref 1.9–3.7)
Glucose, Bld: 100 mg/dL — ABNORMAL HIGH (ref 65–99)
Potassium: 4.2 mmol/L (ref 3.5–5.3)
Sodium: 142 mmol/L (ref 135–146)
Total Bilirubin: 0.7 mg/dL (ref 0.2–1.2)
Total Protein: 6.8 g/dL (ref 6.1–8.1)

## 2018-03-13 LAB — TSH: TSH: 1.15 mIU/L (ref 0.40–4.50)

## 2018-03-13 LAB — LIPID PANEL
Cholesterol: 196 mg/dL (ref <200)
HDL: 51 mg/dL (ref >50)
LDL Cholesterol (Calc): 125 mg/dL (calc) — ABNORMAL HIGH
Non-HDL Cholesterol (Calc): 145 mg/dL (calc) — ABNORMAL HIGH (ref <130)
Total CHOL/HDL Ratio: 3.8 (calc) (ref <5.0)
Triglycerides: 102 mg/dL (ref <150)

## 2018-05-07 DIAGNOSIS — L300 Nummular dermatitis: Secondary | ICD-10-CM | POA: Diagnosis not present

## 2018-05-07 DIAGNOSIS — L853 Xerosis cutis: Secondary | ICD-10-CM | POA: Diagnosis not present

## 2018-05-07 DIAGNOSIS — L578 Other skin changes due to chronic exposure to nonionizing radiation: Secondary | ICD-10-CM | POA: Diagnosis not present

## 2018-05-13 ENCOUNTER — Other Ambulatory Visit: Payer: PRIVATE HEALTH INSURANCE

## 2018-05-13 ENCOUNTER — Ambulatory Visit: Payer: PRIVATE HEALTH INSURANCE

## 2018-05-14 ENCOUNTER — Ambulatory Visit: Payer: PRIVATE HEALTH INSURANCE | Admitting: Family Medicine

## 2018-06-09 ENCOUNTER — Encounter: Payer: Self-pay | Admitting: Family Medicine

## 2018-06-10 ENCOUNTER — Telehealth: Payer: Self-pay | Admitting: *Deleted

## 2018-06-10 ENCOUNTER — Ambulatory Visit (INDEPENDENT_AMBULATORY_CARE_PROVIDER_SITE_OTHER): Payer: Medicare PPO

## 2018-06-10 DIAGNOSIS — M85851 Other specified disorders of bone density and structure, right thigh: Secondary | ICD-10-CM | POA: Diagnosis not present

## 2018-06-10 DIAGNOSIS — Z1231 Encounter for screening mammogram for malignant neoplasm of breast: Secondary | ICD-10-CM | POA: Diagnosis not present

## 2018-06-10 DIAGNOSIS — Z780 Asymptomatic menopausal state: Secondary | ICD-10-CM

## 2018-06-10 NOTE — Telephone Encounter (Signed)
lvm asking for rtn in regards to what information will be needed for the letter that she is requesting. Advised to either respond via phone or my chart.Monica [place], [place]

## 2018-06-12 NOTE — Telephone Encounter (Signed)
Pt's daughter responded via mychart as stated below on 06/11/18:  This message is being sent by Monica [place] on behalf of Monica [place]   Good Morning Monica [place],    Thank you for the call and response. I will be filling out the guardianship forms and will notify the office of what is actually needed.  Again, Thank you and hope you had a wonderful 4th.   Monica [place]    Will await forms for completion.Monica [place], [place]

## 2018-06-25 NOTE — Telephone Encounter (Signed)
Vivien Rota called and states patient's daughter wants Dr Madilyn Fireman to write a letter stating she has medical power of attorney over patient due to her moms dementia.

## 2018-07-08 ENCOUNTER — Encounter: Payer: Self-pay | Admitting: Family Medicine

## 2018-07-14 ENCOUNTER — Other Ambulatory Visit: Payer: Self-pay | Admitting: Family Medicine

## 2018-07-27 NOTE — Telephone Encounter (Signed)
Left message advising of recommendations.  

## 2018-08-06 ENCOUNTER — Ambulatory Visit: Payer: Medicare Other | Admitting: Family Medicine

## 2018-09-28 ENCOUNTER — Encounter: Payer: Self-pay | Admitting: Family Medicine

## 2018-09-28 NOTE — Telephone Encounter (Signed)
Patient schedule for the 8th of November.

## 2018-10-15 ENCOUNTER — Ambulatory Visit (INDEPENDENT_AMBULATORY_CARE_PROVIDER_SITE_OTHER): Payer: Medicare PPO | Admitting: Family Medicine

## 2018-10-15 ENCOUNTER — Encounter: Payer: Self-pay | Admitting: Family Medicine

## 2018-10-15 VITALS — BP 132/59 | HR 59 | Ht 60.24 in | Wt 153.0 lb

## 2018-10-15 DIAGNOSIS — G301 Alzheimer's disease with late onset: Secondary | ICD-10-CM

## 2018-10-15 DIAGNOSIS — F0280 Dementia in other diseases classified elsewhere without behavioral disturbance: Secondary | ICD-10-CM | POA: Diagnosis not present

## 2018-10-15 DIAGNOSIS — R634 Abnormal weight loss: Secondary | ICD-10-CM | POA: Diagnosis not present

## 2018-10-15 MED ORDER — DONEPEZIL HCL 10 MG PO TABS: ORAL_TABLET | ORAL | 1 refills | Status: DC

## 2018-10-15 NOTE — Patient Instructions (Signed)
Ask the pharmacist about getting your tetanus shot and your 2nd Shingrix vaccine.

## 2018-10-15 NOTE — Progress Notes (Signed)
Subjective:    CC: F/U alzheimers  HPI:  77 year old female is here today for follow-up Alzheimer's dementia.  She was last seen a little over 6 months ago.  We repeated her Mini-Mental status exam today.  Her score has dropped from 23 down to 15 out of 30 points.  She personally reports that she is doing well and living alone.  She says she does her own cooking and washing and still is driving.  She reports that she is taking her medications regularly and has not had any side effects or problems.  She reports that her mood is good and denies any depressive or anxiety symptoms.  She reports good sleep overall.  Her sister who is not here at the appointment today but who helps her with her medication says she has not been taking her medication consistently but has been taking some of it.  Her daughter who is also here with her today reports that her dementia is getting progressively worse.  Money has gone missing.  She has been writing checks and they do not know where it is going.  More recently about $10,000 disappeared from her bank account.  Past medical history, Surgical history, Family history not pertinant except as noted below, Social history, Allergies, and medications have been entered into the medical record, reviewed, and corrections made.   Review of Systems: No fevers, chills, night sweats, weight loss, chest pain, or shortness of breath.   Objective:    General: Well Developed, well nourished, and in no acute distress.  Neuro: Alert and oriented x3, extra-ocular muscles intact, sensation grossly intact.  HEENT: Normocephalic, atraumatic  Skin: Warm and dry, no rashes. Cardiac: Regular rate and rhythm, no murmurs rubs or gallops, no lower extremity edema.  Respiratory: Clear to auscultation bilaterally. Not using accessory muscles, speaking in full sentences.   Impression and Recommendations:   Alzheimer's dementia-patient reports that everything is well and going well she denies  any symptoms whatsoever but clearly she has decreased on her Mini-Mental status exam and I really at this point feel like she should not be driving based on her exam and what the family is telling me.  We will write a letter to the [facility] requesting that she no longer be able to drive and or at least minimize to to local driving.  Her daughter now has medical power of attorney but she really needs to work on getting guardianship.  New and  Updated letter given today.  Abnormal weight loss - she is down 7 lbs from 6 mo ago. I am concerned she may not be eating regularly. She reports she is cooking but her daughter says she is not cooking.   Remind her that she is due for her tetanus and her second shingles vaccine.  Time spent 25 min, > 50% spent face to face counseling and testing for Alzheimer's disease.

## 2018-10-16 ENCOUNTER — Ambulatory Visit: Payer: PRIVATE HEALTH INSURANCE | Admitting: Family Medicine

## 2018-10-19 ENCOUNTER — Telehealth: Payer: Self-pay | Admitting: Family Medicine

## 2018-10-19 NOTE — Telephone Encounter (Signed)
Patients daughter's Joen Laura called over the weekend. Pt was acting confused, and  Disoriented. Daughter states mother has not been taking her medication. EMS and police were called and patient agreed to go home with daughter to [place]. Daughter stated they were in the process of finding a doctor for her. Advised daughter to call us if they need anything further.

## 2019-11-24 ENCOUNTER — Encounter: Payer: Self-pay | Admitting: Family Medicine

## 2019-11-24 ENCOUNTER — Ambulatory Visit (INDEPENDENT_AMBULATORY_CARE_PROVIDER_SITE_OTHER): Payer: Medicare PPO | Admitting: Family Medicine

## 2019-11-24 VITALS — Ht 60.24 in

## 2019-11-24 DIAGNOSIS — M8580 Other specified disorders of bone density and structure, unspecified site: Secondary | ICD-10-CM | POA: Diagnosis not present

## 2019-11-24 DIAGNOSIS — F0280 Dementia in other diseases classified elsewhere without behavioral disturbance: Secondary | ICD-10-CM

## 2019-11-24 DIAGNOSIS — G301 Alzheimer's disease with late onset: Secondary | ICD-10-CM

## 2019-11-24 DIAGNOSIS — Z8615 Personal history of latent tuberculosis infection: Secondary | ICD-10-CM | POA: Insufficient documentation

## 2019-11-24 NOTE — Progress Notes (Signed)
Spoke w/pt's daughter Joen Laura and she informed me that her mother IS NOT AWARE OF BEING PLACED IN AN ASSISTED LIVING FACILITY.   I advised Joen Laura that her mother is due for TDAP and Prevnar 13 and TB screening prior to her being placed. She stated that she believes that she has had the Tdap done at the [facility] clinic she has been seen by Drema Halon . Will check this on Epic for dates. Did not see this in her chart upon review

## 2019-11-24 NOTE — Progress Notes (Signed)
Virtual Visit via Video Note  I connected with Monica [place] on 11/24/19 at  9:30 AM EST by a video enabled telemedicine application and verified that I am speaking with the correct person using two identifiers.   I discussed the limitations of evaluation and management by telemedicine and the availability of in person appointments. The patient expressed understanding and agreed to proceed.  Subjective:    CC: Needs FL 2 completed.  HPI: 78 year old female with a history of light Alzheimer's disease who is currently on Aricept and Namenda and was one recently being followed by the [facility] clinic in [facility] is looking into options for assisted living facility.  I spoke with her daughter Monica [place] today.  Unfortunately her Alzheimer's has progressed.  She is still able to dress and bathe herself and do routine hygiene.  She is ambulatory.  She is no longer driving.  She does not have any bowel or bladder incontinence.  She has normal skin and no special diet.  She is beginning more confused particularly in the evenings and sometimes will forget people around her.  Her daughter is trying to get her into an assisted living called [facility] here in [place] and will be close to her sister who lives in the area and who is also a patient here.  She is still taking Aricept and Namenda and has not had any addition of new medications.  She will need up-to-date vaccines.  Her daughter did let me know that she was diagnosed with latent TB and had a full treatment earlier this year.   Past medical history, Surgical history, Family history not pertinant except as noted below, Social history, Allergies, and medications have been entered into the medical record, reviewed, and corrections made.   Review of Systems: No fevers, chills, night sweats, weight loss, chest pain, or shortness of breath.   Objective:       Impression and Recommendations:    History of latent tuberculosis \\Completed  full  treatment with rifampin in 2020.  Chest x-ray June 24, 2019 - infiltrate.  Late onset Alzheimer's disease without behavioral disturbance (HCC) Continue Aricept and Namenda.  FL 2 completed for admission to [facility] for assisted living here in [place].  Please see scanned form.  Did let the daughter know that she will need to get the Tdap updated we did not have one on file.  She has had Pneumovax 23 but does need the 13 or at least recommended.  Maudry Mayhew looks like she did receive the flu vaccine this year which is great.    I discussed the assessment and treatment plan with the patient. The patient was provided an opportunity to ask questions and all were answered. The patient agreed with the plan and demonstrated an understanding of the instructions.   The patient was advised to call back or seek an in-person evaluation if the symptoms worsen or if the condition fails to improve as anticipated.  Time spent in nonface-to-face encounter 25 minutes.   Beatrice Lecher, MD

## 2019-11-24 NOTE — Assessment & Plan Note (Addendum)
Continue Aricept and Namenda.  FL 2 completed for admission to [facility] for assisted living here in [place].  Please see scanned form.  Did let the daughter know that she will need to get the Tdap updated we did not have one on file.  She has had Pneumovax 23 but does need the 13 or at least recommended.  Maudry Mayhew looks like she did receive the flu vaccine this year which is great.

## 2019-11-24 NOTE — Assessment & Plan Note (Addendum)
\  Completed full treatment with rifampin in 2020.  Chest x-ray June 24, 2019 - infiltrate.

## 2019-12-14 ENCOUNTER — Encounter: Payer: Self-pay | Admitting: Family Medicine

## 2019-12-17 NOTE — Telephone Encounter (Signed)
Received a call from Brady at [facility]. They use [facility].

## 2019-12-21 ENCOUNTER — Telehealth: Payer: Self-pay

## 2019-12-21 MED ORDER — CLONAZEPAM 0.5 MG PO TABS: 0.5000 mg | ORAL_TABLET | Freq: Every day | ORAL | 0 refills | Status: AC | PRN

## 2019-12-21 NOTE — Telephone Encounter (Signed)
Prescription sent

## 2019-12-21 NOTE — Telephone Encounter (Signed)
Monica [place] with [facility] called and states the pharmacy needs and electronic prescription for the Klonopin. I have pended the pharmacy.

## 2020-01-17 ENCOUNTER — Telehealth: Payer: Self-pay | Admitting: *Deleted

## 2020-01-17 MED ORDER — MEMANTINE HCL 10 MG PO TABS: 10.0000 mg | ORAL_TABLET | Freq: Two times a day (BID) | ORAL | 1 refills | Status: DC

## 2020-01-17 MED ORDER — DONEPEZIL HCL 10 MG PO TABS: ORAL_TABLET | ORAL | 1 refills | Status: DC

## 2020-01-17 NOTE — Telephone Encounter (Signed)
Printed and faxed .Elouise Munroe, [place]

## 2020-03-13 ENCOUNTER — Telehealth: Payer: Self-pay | Admitting: Family Medicine

## 2020-03-13 NOTE — Telephone Encounter (Signed)
Received urinalysis and urine culture results from [facility].  It was negative.

## 2024-05-09 DEATH — deceased
